# Patient Record
Sex: Female | Born: 1950 | Race: White | Hispanic: No | Marital: Married | Smoking: Never smoker
Health system: Southern US, Community
[De-identification: ages and names within clinical notes are randomized; demographics above are authoritative.]

## PROBLEM LIST (undated history)

## (undated) DIAGNOSIS — R519 Headache, unspecified: Secondary | ICD-10-CM

## (undated) DIAGNOSIS — R51 Headache: Secondary | ICD-10-CM

## (undated) DIAGNOSIS — N3010 Interstitial cystitis (chronic) without hematuria: Secondary | ICD-10-CM

## (undated) DIAGNOSIS — M549 Dorsalgia, unspecified: Secondary | ICD-10-CM

## (undated) DIAGNOSIS — M1990 Unspecified osteoarthritis, unspecified site: Secondary | ICD-10-CM

## (undated) DIAGNOSIS — K219 Gastro-esophageal reflux disease without esophagitis: Secondary | ICD-10-CM

## (undated) DIAGNOSIS — L817 Pigmented purpuric dermatosis: Secondary | ICD-10-CM

## (undated) DIAGNOSIS — K5792 Diverticulitis of intestine, part unspecified, without perforation or abscess without bleeding: Secondary | ICD-10-CM

## (undated) DIAGNOSIS — I10 Essential (primary) hypertension: Secondary | ICD-10-CM

## (undated) DIAGNOSIS — K589 Irritable bowel syndrome without diarrhea: Secondary | ICD-10-CM

## (undated) DIAGNOSIS — F419 Anxiety disorder, unspecified: Secondary | ICD-10-CM

## (undated) HISTORY — PX: CYSTOSTOMY W/ BLADDER DILATION: SHX1432

## (undated) HISTORY — PX: BACK SURGERY: SHX140

## (undated) HISTORY — PX: OTHER SURGICAL HISTORY: SHX169

## (undated) HISTORY — PX: BREAST SURGERY: SHX581

## (undated) HISTORY — PX: BREAST EXCISIONAL BIOPSY: SUR124

## (undated) HISTORY — PX: CHOLECYSTECTOMY: SHX55

## (undated) HISTORY — PX: ABDOMINAL HYSTERECTOMY: SHX81

## (undated) HISTORY — PX: MENISCUS REPAIR: SHX5179

## (undated) HISTORY — PX: DENTAL SURGERY: SHX609

---

## 1999-03-28 ENCOUNTER — Other Ambulatory Visit: Admission: RE | Admit: 1999-03-28 | Discharge: 1999-03-28 | Payer: Self-pay | Admitting: Obstetrics and Gynecology

## 2000-03-27 ENCOUNTER — Encounter: Admission: RE | Admit: 2000-03-27 | Discharge: 2000-03-27 | Payer: Self-pay

## 2001-04-12 ENCOUNTER — Encounter: Admission: RE | Admit: 2001-04-12 | Discharge: 2001-04-12 | Payer: Self-pay | Admitting: Obstetrics and Gynecology

## 2001-04-12 ENCOUNTER — Encounter: Payer: Self-pay | Admitting: Obstetrics and Gynecology

## 2001-05-11 ENCOUNTER — Other Ambulatory Visit: Admission: RE | Admit: 2001-05-11 | Discharge: 2001-05-11 | Payer: Self-pay | Admitting: Obstetrics and Gynecology

## 2001-11-02 ENCOUNTER — Encounter: Admission: RE | Admit: 2001-11-02 | Discharge: 2001-11-02 | Payer: Self-pay | Admitting: Internal Medicine

## 2001-11-02 ENCOUNTER — Encounter: Payer: Self-pay | Admitting: Internal Medicine

## 2002-03-07 ENCOUNTER — Encounter: Admission: RE | Admit: 2002-03-07 | Discharge: 2002-03-07 | Payer: Self-pay | Admitting: Oral Surgery

## 2002-03-07 ENCOUNTER — Encounter: Payer: Self-pay | Admitting: Oral Surgery

## 2002-03-09 ENCOUNTER — Ambulatory Visit (HOSPITAL_BASED_OUTPATIENT_CLINIC_OR_DEPARTMENT_OTHER): Admission: RE | Admit: 2002-03-09 | Discharge: 2002-03-09 | Payer: Self-pay | Admitting: *Deleted

## 2002-05-12 ENCOUNTER — Encounter: Admission: RE | Admit: 2002-05-12 | Discharge: 2002-05-12 | Payer: Self-pay | Admitting: Obstetrics and Gynecology

## 2002-05-12 ENCOUNTER — Encounter: Payer: Self-pay | Admitting: Obstetrics and Gynecology

## 2002-05-12 ENCOUNTER — Other Ambulatory Visit: Admission: RE | Admit: 2002-05-12 | Discharge: 2002-05-12 | Payer: Self-pay | Admitting: Obstetrics and Gynecology

## 2003-06-20 ENCOUNTER — Encounter: Admission: RE | Admit: 2003-06-20 | Discharge: 2003-06-20 | Payer: Self-pay | Admitting: Obstetrics and Gynecology

## 2004-07-31 ENCOUNTER — Encounter: Admission: RE | Admit: 2004-07-31 | Discharge: 2004-07-31 | Payer: Self-pay | Admitting: Obstetrics and Gynecology

## 2004-10-11 ENCOUNTER — Other Ambulatory Visit: Admission: RE | Admit: 2004-10-11 | Discharge: 2004-10-11 | Payer: Self-pay | Admitting: Obstetrics and Gynecology

## 2005-03-17 ENCOUNTER — Inpatient Hospital Stay (HOSPITAL_COMMUNITY): Admission: AD | Admit: 2005-03-17 | Discharge: 2005-03-18 | Payer: Self-pay | Admitting: Internal Medicine

## 2005-09-09 ENCOUNTER — Encounter: Admission: RE | Admit: 2005-09-09 | Discharge: 2005-09-09 | Payer: Self-pay | Admitting: Obstetrics and Gynecology

## 2005-09-25 ENCOUNTER — Encounter: Admission: RE | Admit: 2005-09-25 | Discharge: 2005-09-25 | Payer: Self-pay | Admitting: Obstetrics and Gynecology

## 2006-09-22 ENCOUNTER — Encounter: Admission: RE | Admit: 2006-09-22 | Discharge: 2006-09-22 | Payer: Self-pay | Admitting: Obstetrics and Gynecology

## 2007-03-01 ENCOUNTER — Ambulatory Visit (HOSPITAL_COMMUNITY): Admission: RE | Admit: 2007-03-01 | Discharge: 2007-03-01 | Payer: Self-pay | Admitting: Urology

## 2007-04-05 ENCOUNTER — Encounter: Admission: RE | Admit: 2007-04-05 | Discharge: 2007-04-05 | Payer: Self-pay | Admitting: Internal Medicine

## 2007-05-03 ENCOUNTER — Encounter (INDEPENDENT_AMBULATORY_CARE_PROVIDER_SITE_OTHER): Payer: Self-pay | Admitting: General Surgery

## 2007-05-03 ENCOUNTER — Ambulatory Visit (HOSPITAL_COMMUNITY): Admission: RE | Admit: 2007-05-03 | Discharge: 2007-05-03 | Payer: Self-pay | Admitting: General Surgery

## 2007-08-08 ENCOUNTER — Emergency Department (HOSPITAL_COMMUNITY): Admission: EM | Admit: 2007-08-08 | Discharge: 2007-08-08 | Payer: Self-pay | Admitting: Emergency Medicine

## 2007-11-24 ENCOUNTER — Encounter: Admission: RE | Admit: 2007-11-24 | Discharge: 2007-11-24 | Payer: Self-pay | Admitting: Obstetrics and Gynecology

## 2007-12-06 ENCOUNTER — Encounter: Admission: RE | Admit: 2007-12-06 | Discharge: 2007-12-06 | Payer: Self-pay | Admitting: Obstetrics and Gynecology

## 2008-09-04 DIAGNOSIS — C4491 Basal cell carcinoma of skin, unspecified: Secondary | ICD-10-CM

## 2008-09-04 HISTORY — DX: Basal cell carcinoma of skin, unspecified: C44.91

## 2009-03-29 ENCOUNTER — Encounter: Admission: RE | Admit: 2009-03-29 | Discharge: 2009-03-29 | Payer: Self-pay | Admitting: Obstetrics and Gynecology

## 2009-08-08 ENCOUNTER — Encounter: Admission: RE | Admit: 2009-08-08 | Discharge: 2009-08-08 | Payer: Self-pay | Admitting: Internal Medicine

## 2009-09-13 DIAGNOSIS — D239 Other benign neoplasm of skin, unspecified: Secondary | ICD-10-CM

## 2009-09-13 HISTORY — DX: Other benign neoplasm of skin, unspecified: D23.9

## 2010-02-14 ENCOUNTER — Encounter: Admission: RE | Admit: 2010-02-14 | Discharge: 2010-02-14 | Payer: Self-pay | Admitting: Sports Medicine

## 2010-09-15 ENCOUNTER — Encounter: Payer: Self-pay | Admitting: Obstetrics and Gynecology

## 2011-01-07 NOTE — Op Note (Signed)
Rebecca Charles, Rebecca Charles                 ACCOUNT NO.:  [id number]   MEDICAL RECORD NO.:  [id number]          PATIENT TYPE:  AMB   LOCATION:  DAY                          FACILITY:  [facility]   PHYSICIAN:  Timothy E. Earlene Plater, M.D. DATE OF BIRTH:  [birth year]   DATE OF PROCEDURE:  DATE OF DISCHARGE:                               OPERATIVE REPORT   PREOPERATIVE DIAGNOSIS:  Chronic cholecystolithiasis.   POSTOPERATIVE DIAGNOSIS:  Chronic cholecystolithiasis.   PROCEDURE:  Laparoscopic cholecystectomy and operative cholangiogram.   SURGEON:  Kendrick Ranch, MD   ASSISTANT:  Marca Ancona, MD   ANESTHESIA:  General.   INDICATION:  [name] is otherwise healthy, 74, with longstanding  chronic cholecystolithiasis symptoms and documented gallstones.  After  careful discussion, she is ready and prepared to have this surgery done  today.   She was seen, identified and the permit signed.   DESCRIPTION OF PROCEDURE:  She is taken to the operating room, placed  supine and general endotracheal anesthesia administered.  The abdomen  was prepped and draped in the usual fashion.  Marcaine 0.25% with  epinephrine was used prior to each incision.  A vertical infraumbilical  incision was made, the fascia identified and opened vertically.  As  well, a tiny lobule of preperitoneal fat protruded through a defect in  the fascia; this fat was cauterized and removed and the incision  extended into the miniscule defect in the fascia.  Hasson cannula was  placed and tied in place with a #1 Vicryl, the abdomen insufflated.  General peritoneoscopy was unremarkable.  The gallbladder was thickened,  but with minimal adhesions.  Stones were present.  A second 10-mm trocar  was placed in the midepigastrium and two 5-mm trocars in the right upper  quadrant.  The gallbladder was grasped and placed on tension.  Using  careful dissection and cautery, the cystic duct was isolated in its  entirety.  A clip was placed on the  gallbladder side and the duct was  opened.  A percutaneously-passed catheter was placed in the remnant of  the cystic duct and a real-time cholangiogram made, showing a small  system, but free flow of dye into the duodenum and a complete anatomic  system as well as the pancreatic duct.  The clip and catheter were  removed, the stump of the cystic duct doubly clipped and divided.  Two  branches of the cystic artery were identified; each was individually  clipped and divided.  The gallbladder was removed from the gallbladder  bed without complication or incident.  The gallbladder was removed  through the infraumbilical incision, which was then tied under direct  vision.  Irrigation was carried out, revealing no blood or bile.  All  irrigant, CO2, instruments and trocars were removed under direct vision.  All counts were correct.  Each incision was checked and closed with  Monocryl and Steri-Strips.  Final counts were correct.  She tolerated it  well and was removed to the recovery room in good condition.   She will be watched the afternoon and expect to be sent home today.  She  will be followed in the office.      Timothy E. Earlene Plater, M.D.  Electronically Signed     TED/MEDQ  D:  05/03/2007  T:  05/04/2007  Job:  [id number]   cc:   Georgann Housekeeper, MD  Fax: [phone]

## 2011-01-07 NOTE — Op Note (Signed)
NAMEMEL, TADROS                 ACCOUNT NO.:  [id number]   MEDICAL RECORD NO.:  [id number]          PATIENT TYPE:  AMB   LOCATION:  DAY                          FACILITY:  [facility]   PHYSICIAN:  Sigmund I. Patsi Sears, M.D.DATE OF BIRTH:  02-May-1951   DATE OF PROCEDURE:  03/01/2007  DATE OF DISCHARGE:                               OPERATIVE REPORT   PREOPERATIVE DIAGNOSIS:  Interstitial cystitis.   POSTOPERATIVE DIAGNOSIS:  Interstitial cystitis.   OPERATION:  Cystourethroscopy, hydrodistention of the bladder (700-750  mL), photo documentation of interstitial cystitis, instillation of  Pyridium and Marcaine, injection of Marcaine and Pyridium in the  subtrigonal space, B&O suppository, an IV Toradol placement.   SURGEON:  Sigmund I. Patsi Sears, M.D.   ANESTHESIA:  General LMA.   PREPARATION:  After appropriate preanesthesia, the patient was brought  to the operating room and placed on the operating room table in the  dorsal supine position where general LMA anesthesia was introduced.  She  was then replaced in the dorsal lithotomy position where the pubis was  prepped with Betadine solution and draped in usual fashion.   REVIEW OF HISTORY:  This 61 year old female has history of interstitial  cystitis, status post cysto HOD with many years of normal voiding.  However, over the last several months, the patient has developed  dyspareunia, suprapubic pain, vaginal discomfort, and spasm.  She is now  for cysto, HOD, and bladder instillation.   PROCEDURE:  Cystourethroscopy was accomplished and shows a normal-  appearing bladder.  The bladder holds 700 mL and is hydrodistended to  750 mL.  Second look cysto shows that the patient has multiple areas of  hemorrhage, and these are photo documented.   Marcaine and Pyridium is inserted in the bladder, Marcaine and Kenalog  is injected the subtrigonal space.  The patient was awakened and taken  to the recovery room after being given  Toradol.  The the patient was  given a B&O suppository at anesthetic induction.      Sigmund I. Patsi Sears, M.D.  Electronically Signed     SIT/MEDQ  D:  03/01/2007  T:  03/01/2007  Job:  [id number]

## 2011-01-10 NOTE — Cardiovascular Report (Signed)
Rebecca Charles, Rebecca Charles                 ACCOUNT NO.:  [id number]   MEDICAL RECORD NO.:  [id number]          PATIENT TYPE:  INP   LOCATION:  4707                         FACILITY:  [facility]   PHYSICIAN:  Lyn Records, M.D.   DATE OF BIRTH:  03-29-1951   DATE OF PROCEDURE:  03/18/2005  DATE OF DISCHARGE:                              CARDIAC CATHETERIZATION   INDICATIONS FOR PROCEDURE:  Prolonged chest discomfort consistent with  ischemic pain and new left bundle branch block.   PROCEDURES PERFORMED:  1.  Left heart catheterization.  2.  Coronary angiography.  3.  Left ventriculography.  4.  AngioSeal arteriotomy closure.   DESCRIPTION:  After informed consent, a 6-French sheath was placed in the  right femoral artery using the modified Seldinger technique, following 1%  Xylocaine local infiltration. IV Versed 1 mg had been administered for  sedation.   Left heart cath and selective angiography of the right coronary was  performed with an A2 multipurpose 6-French catheter. Left coronary  angiography was performed with #4 6-French left Judkins catheter. AngioSeal  arteriotomy closure was performed after angiographic demonstration of  appropriate anatomy. Good hemostasis was achieved.   RESULTS:  1.  Hemodynamic data:      1.  Aortic pressure 154/71.      2.  Left ventricular pressure 156/60 mmHg.   1.  Left ventriculography:  Left ventricular cavity size and systolic      function are normal. EF is 65-75%. No MR.   1.  Coronary angiography:      1.  Left main coronary:  Normal.      2.  Left anterior descending coronary:  This is a large vessel, multiple          moderate size diagonals arise from it. No obstruction is noted in          the LAD and in fact, it was felt to be normal.      3.  Circumflex artery:  Circumflex gives origin to one obtuse marginal          and is normal.      4.  Right coronary:  The right coronary is dominant, gives several large          left  ventricular branches and a large PDA. The right coronary is          normal.   1.  Successful Angio-Seal arteriotomy closure with good hemostasis.   CONCLUSION:  1.  Essentially normal coronary arteries.  2.  Normal left ventricular function size and function. No evidence of      cardiomyopathy.   PLAN:  Blood pressure control.  Discharge later today. Further management  per Dr. Donette Larry.  Consider GI workup.      Lyn Records, M.D.  Electronically Signed     HWS/MEDQ  D:  03/18/2005  T:  03/18/2005  Job:  [id number]

## 2011-01-10 NOTE — Op Note (Signed)
[place]  Patient:    Rebecca Charles, Rebecca Charles Visit Number: [id number] MRN: [id number]          Service Type: DSU Location: [facility] Attending Physician:  Retia Passe Dictated by:   Vania Rea. Warren Danes, D.D.S. Proc. Date: 03/09/02 Admit Date:  03/09/2002 Discharge Date: 03/09/2002                             Operative Report  PREOPERATIVE DIAGNOSES:  Bilateral mandibulolingual tori; left mandibular posterior alveolar insufficiency.  POSTOPERATIVE DIAGNOSES:  Bilateral manidbulolingual tori; left mandibular posterior alveolar insufficiency.  SURGERY PERFORMED:  Removal of bilateral mandibular tori; placement of BIO-OSS bone grafting material - left mandible.  SURGEON:  Vania Rea. Warren Danes, D.D.S.  ASSISTANTS:  [employer].  ANESTHESIA:  General via nasoendotracheal intubation.  ESTIMATED BLOOD LOSS:  Less than 50 cc.  FLUID REPLACEMENT:  Approximately 1000 cc of Crystalloid solution.  COMPLICATIONS:  None apparent.  INDICATIONS FOR PROCEDURE:  The patient is a 60 year old female who was referred to my office for reduction of the mandibular tori and placement of bone graft in the left mandible.  The patients large tori were preventing dental radiographs and causing chronic pain with feeding and chewing.  The patient was in need of dental implants in the left mandible to aid in chewing, but due to severe mandibular atrophy, implants could not be placed unless bone grafting material was used to reconstruct the mandible to accommodate the implants.  DETAILS OF PROCEDURE:  On March 09, 2002 the patient was taken to [facility], where she was placed on the operating table in a supine position.  Upon successful nasoendotracheal intubation and general anesthesia, the patients face, and oral cavity were prepped and draped in the usual sterile operating room fashion.  Attention was then directed  intraorally, where approximately 10 cc of 0.5% Xylocaine containing 1:200,000 epinephrine were infiltrated into the right retroinferior alveolar neurovascular regions.  A #15 Bard-Parker blade was then used to create a full-thickness mucoperiosteal incision around the lingual aspect of the mandibular dentition, from the first molar to the canine region bilaterally. A #9 Molt periosteal elevator was then used to reflect a full-thickness periosteal fat, exposing the right and left mandibulolingual tori.  The tori were identified and then reduced, using a Stryker rotary osteotome; then a #8 round bur and a flame-shaped alveolar bur.  The area was then thoroughly irrigated with sterile saline irrigating solutions, and the mucoperiosteal margins approximated and sutured in an interrupted fashion using 4-0 chromic suture material.  Attention was then directed towards the left posterolateral mandible, where a 1.0 curvilinear incision was made; the depth of the vestibule beside tooth #20.  A #9 Molt periosteal elevator was then used to reflect a full-thickness mucoperiosteal flap along the alveolar crest, and laterally for a distance of approximately 0.5 cm.  Approximately 1 g of BIO-OSS bone grafting material was then placed between the periosteum and the alveolus, reconstructing the posterior left mandible to its normal anatomic shape.  The area was then sutured in a watertight fashion, using 4-0 chromic suture material on a continuous interlocking fashion on a FS2 needle.  The oral cavity was then thoroughly irrigated and suctioned.  The throat pack was removed and the hypopharynx suctioned free of fluids and secretions.  The patient was allowed to awaken from the anesthesia.  She was taken to the recovery room, where she  tolerated the procedure well without apparent complication. Dictated by:   Vania Rea. Warren Danes, D.D.S. Attending Physician:  Retia Passe DD:  03/09/02 TD:   03/12/02 Job: [id number]

## 2011-01-10 NOTE — Consult Note (Signed)
Rebecca Charles, Rebecca Charles                 ACCOUNT NO.:  [id number]   MEDICAL RECORD NO.:  [id number]          PATIENT TYPE:  INP   LOCATION:  4707                         FACILITY:  [facility]   PHYSICIAN:  Lyn Records, M.D.   DATE OF BIRTH:  [birth year]   DATE OF CONSULTATION:  03/17/2005  DATE OF DISCHARGE:                                   CONSULTATION   REASON FOR CONSULTATION:  Chest discomfort.   CONCLUSION:  1.  Prolonged chest discomfort of uncertain cause.  Rule out unstable      angina.  2.  New left bundle branch block on EKG.  3.  Hypertension.  4.  Family history of coronary artery disease.   RECOMMENDATIONS:  1.  Serial cardiac enzymes to rule out MI.  2.  EKG in a.m.  3.  Check chest x-ray and BNP.  4.  Aspirin.  5.  Lovenox and heparin.  6.  IV nitroglycerin.  7.  Coronary angiography March 18, 2005, to establish coronary anatomy, LV      function and to help guide therapy.   COMMENTS:  The patient is 60 years of age and has a significant family  history of coronary atherosclerotic heart disease.  She developed complaints  of chest tightness on the morning of admission that had some radiation into  the neck.  There was some shortness of breath noted over the past several  weeks, felt to be possibly related to reflux.  IV nitroglycerin, once  started here in the hospital, has relieved the chest discomfort but caused a  headache.   Her usual medical therapy includes estradiol and Fiorinal.   SIGNIFICANT MEDICAL PROBLEMS:  1.  History of migraines.  2.  Irritable colon.  3.  Elevated blood pressure.   FAMILY HISTORY:  Her father and brother both had or have coronary disease  prior to age 3.  Her lipid status is unknown.   HABITS:  She does not smoke or drink.   PHYSICAL EXAMINATION:  GENERAL APPEARANCE:  Patient is in no acute distress.  VITAL SIGNS:  Blood pressure on admission was 160/90, by the time I am  seeing her it is 140/80 but she is on IV nitroglycerin.   Heart rate is 60.  HEENT:  Unremarkable.  No xanthelasma.  NECK:  No carotid bruits are heard.  Carotid upstroke is 2+.  LUNGS:  Clear.  CARDIOVASCULAR:  Unremarkable.  No murmurs, rubs, gallops, or clicks heard.  ABDOMEN:  Soft.  EXTREMITIES:  No edema.  NEUROLOGIC:  Unremarkable.   EKG reveals left bundle branch block without acute abnormality being noted.  This is in comparison with an EKG performed in February of 2002, that was  entirely normal.  Cardiac markers are normal.   Chest x-ray is unremarkable.      Lyn Records, M.D.  Electronically Signed     HWS/MEDQ  D:  03/18/2005  T:  03/18/2005  Job:  [id number]

## 2011-01-10 NOTE — Discharge Summary (Signed)
Rebecca Charles, Rebecca Charles                 ACCOUNT NO.:  [id number]   MEDICAL RECORD NO.:  [id number]          PATIENT TYPE:  INP   LOCATION:  4707                         FACILITY:  [facility]   PHYSICIAN:  Georgann Housekeeper, MD      DATE OF BIRTH:  October 02, 1950   DATE OF ADMISSION:  03/17/2005  DATE OF DISCHARGE:  03/18/2005                                 DISCHARGE SUMMARY   DISCHARGE DIAGNOSES:  1.  Chest pain, rule out myocardial infarction.  2.  Hypertension, and as far as the acid reflux disease and anxiety      disorder, __________   MEDICATION ON DISCHARGE:  1.  Fiorinal 1-2 q.6 p.r.n.  2.  Calcium.  3.  Toprol 50 mg daily.   CONSULTATION:  Cardiology.   PROCEDURE:  Cardiac catheterization showed a normal cath, normal LV  function, and as far as the chest x-ray negative.  EKG:  Left bundle branch  block.   HOSPITAL COURSE:  The patient is 60 years old with history of hypertension,  admitted from the office with chest discomfort along with history of left  bundle branch block, questionable new.  Admitted to telemetry, started with  beta-blocker, IV nitroglycerin and IV heparin and aspirin.  Cardiology  consulted because of family history and her hypertension and underwent  cardiac catheterization which was completely negative cath.  Normal LV  function.  Her blood chemistries and cardiac markers were negative.  She had  started on Toprol, her cardiac chest pain was felt to be noncardiac or  possibly related to hypertension.  Her blood pressure initially on admission  was 170/90, at the time of discharge was 154/70.  For acid reflux disease,  patient had been on Aciphex at home, was to continue that.  Patient was  discharged home stable.  Followup with me at the office for blood pressure  monitoring.      Georgann Housekeeper, MD  Electronically Signed     KH/MEDQ  D:  05/13/2005  T:  05/13/2005  Job:  [id number]

## 2011-01-10 NOTE — H&P (Signed)
NAMENAVNEET, SCHMUCK                 ACCOUNT NO.:  [id number]   MEDICAL RECORD NO.:  [id number]          PATIENT TYPE:  INP   LOCATION:  4707                         FACILITY:  [facility]   PHYSICIAN:  Georgann Housekeeper, MD      DATE OF BIRTH:  Sep 09, 1950   DATE OF ADMISSION:  03/17/2005  DATE OF DISCHARGE:                                HISTORY & PHYSICAL   CHIEF COMPLAINT:  Chest pain, shortness of breath.   HISTORY OF PRESENT ILLNESS:  A 60 year old female with history of mild  migraine headaches, postmenopausal, elevated blood pressure in the past, who  comes in today to the office complaining of this chest discomfort and  tightness that started today.  Some shortness of breath, no nausea and  vomiting.  No palpitations.  She says the pain sometimes goes up the neck.  She had in the past some chest discomfort and she attributed that to acid  reflux disease.  Today in the office her blood pressure initially was 180/90  and then dropped down to 160/90.  As far as she was not diaphoretic.  EKG  obtained in the office showed a left bundle branch block with a rate of 70.  Prior EKG in 2002 showed normal sinus rhythm.  As far as she did receive a  nitroglycerin in the office.  Her chest pain and shortness of breath much  improved.  Initially she had a tightness of 4 to 6 range and came down to 2  and hemodynamically stable.  She was admitted for further evaluation.   PAST MEDICAL HISTORY:  1.  History of migraine headaches.  2.  History of interstitial cystitis followed by Lynelle Smoke I. Patsi Sears, M.D.      for bladder dilation in 1992.  3.  Irritable bowel syndrome.  4.  Elevated blood pressure.  5.  Squamous cell skin cancer of the nose.  6.  Mild acid reflux disease.   ALLERGIES:  No known drug allergies.   MEDICATIONS:  1.  Fiorinal one to two q.4-6h. p.r.n.  2.  Estradiol 2 mg daily.  3.  Calcium, vitamin D  4.  Perdiem Plus one tablet for interstitial cystitis as needed.   PAST  SURGICAL HISTORY:  TAH and cyst removed.  Bilateral dilation for  interstitial cystitis.  Right lumpectomy. Cyst drained in March of 1999,  benign.  Arthroscopic left knee surgery in 2001.   FAMILY HISTORY:  Father died at 53 with Wegner's disease and CHF.  Mother is  86 with hypertension.  One brother has coronary artery disease.   SOCIAL HISTORY:  No tobacco.  Alcohol occasional.  Exercise some.   PHYSICAL EXAMINATION:  VITAL SIGNS:  Blood pressure initially 170/90,  subsequently was 180/90, and then 160/90, pulse 70, afebrile, respirations  14.  GENERAL:  Awake and alert, no acute distress with some discomfort.  No  diaphoresis and no nausea.  LUNGS:  Clear.  HEART:  Regular S1 and S2 without murmurs.  No carotid bruits.  ABDOMEN:  Soft.  EXTREMITIES:  No edema.   EKG obtained in the office showed  left bundle branch block with a rate of 70  without any ST T wave changes.  Prior EKG in 2002 showed normal sinus  rhythm.   IMPRESSION:  A 60 year old female with chest discomfort, elevated blood  pressure, new left bundle branch block on EKG.   1.  Chest pain, rule out ischemic etiology.  2.  Unstable angina.  3.  Elevated blood pressure.   PLAN:  Admit to telemetry at [facility]  Will get some blood work,  cardiac markers, and other blood work.  Chest x-ray.  Start on nitroglycerin  drip, heparin drip, and aspirin.  Lopressor 25 b.i.d.  Cardiology consult.  She probably needs a cardiac catheterization even she ruled out because of  her family history and left bundle branch block.  I discussed with her and  her husband and the patient was sent to [facility] and cardiology  consulted.       KH/MEDQ  D:  03/17/2005  T:  03/17/2005  Job:  [id number]

## 2011-02-17 ENCOUNTER — Other Ambulatory Visit: Payer: Self-pay | Admitting: Obstetrics and Gynecology

## 2011-02-17 DIAGNOSIS — Z1231 Encounter for screening mammogram for malignant neoplasm of breast: Secondary | ICD-10-CM

## 2011-03-05 ENCOUNTER — Ambulatory Visit
Admission: RE | Admit: 2011-03-05 | Discharge: 2011-03-05 | Disposition: A | Discharge status: Home / Self Care | Payer: BC Managed Care – PPO | Source: Ambulatory Visit | Attending: Obstetrics and Gynecology | Admitting: Obstetrics and Gynecology

## 2011-03-05 DIAGNOSIS — Z1231 Encounter for screening mammogram for malignant neoplasm of breast: Secondary | ICD-10-CM

## 2011-03-10 ENCOUNTER — Other Ambulatory Visit: Payer: Self-pay | Admitting: Obstetrics and Gynecology

## 2011-03-10 DIAGNOSIS — R928 Other abnormal and inconclusive findings on diagnostic imaging of breast: Secondary | ICD-10-CM

## 2011-03-11 ENCOUNTER — Ambulatory Visit
Admission: RE | Admit: 2011-03-11 | Discharge: 2011-03-11 | Disposition: A | Discharge status: Home / Self Care | Payer: BC Managed Care – PPO | Source: Ambulatory Visit | Attending: Obstetrics and Gynecology | Admitting: Obstetrics and Gynecology

## 2011-03-11 DIAGNOSIS — R928 Other abnormal and inconclusive findings on diagnostic imaging of breast: Secondary | ICD-10-CM

## 2011-03-21 ENCOUNTER — Other Ambulatory Visit: Payer: Self-pay | Admitting: Neurosurgery

## 2011-03-21 DIAGNOSIS — M549 Dorsalgia, unspecified: Secondary | ICD-10-CM

## 2011-03-23 ENCOUNTER — Ambulatory Visit
Admission: RE | Admit: 2011-03-23 | Discharge: 2011-03-23 | Disposition: A | Discharge status: Home / Self Care | Payer: BC Managed Care – PPO | Source: Ambulatory Visit | Attending: Neurosurgery | Admitting: Neurosurgery

## 2011-03-23 DIAGNOSIS — M549 Dorsalgia, unspecified: Secondary | ICD-10-CM

## 2011-03-23 MED ORDER — GADOBENATE DIMEGLUMINE 529 MG/ML IV SOLN
12.0000 mL | Freq: Once | INTRAVENOUS | Status: AC | PRN
  Administered 2011-03-23: 12 mL via INTRAVENOUS

## 2011-06-06 LAB — DIFFERENTIAL
Basophils Absolute: 0
Basophils Relative: 1
Eosinophils Absolute: 0
Eosinophils Relative: 0
Lymphocytes Relative: 31
Lymphs Abs: 1.1
Monocytes Absolute: 0.3
Monocytes Relative: 8
Neutro Abs: 2.2
Neutrophils Relative %: 60

## 2011-06-06 LAB — CBC
HCT: 38.6
Hemoglobin: 13.2
MCHC: 34.3
MCV: 90.5
Platelets: 174
RBC: 4.26
RDW: 13
WBC: 3.6 — ABNORMAL LOW

## 2011-06-06 LAB — COMPREHENSIVE METABOLIC PANEL
ALT: 16
AST: 20
Albumin: 4.1
Alkaline Phosphatase: 74
BUN: 6
CO2: 27
Calcium: 9.6
Chloride: 106
Creatinine, Ser: 1.02
GFR calc Af Amer: >60
GFR calc non Af Amer: 56 — ABNORMAL LOW
Glucose, Bld: 94
Potassium: 4.1
Sodium: 140
Total Bilirubin: 0.9
Total Protein: 7

## 2011-06-10 LAB — HEMOGLOBIN AND HEMATOCRIT, BLOOD
HCT: 38.4
Hemoglobin: 13.2

## 2011-06-10 LAB — BASIC METABOLIC PANEL
BUN: 7
CO2: 27
Calcium: 9.6
Chloride: 106
Creatinine, Ser: 0.85
GFR calc Af Amer: >60
GFR calc non Af Amer: >60
Glucose, Bld: 103 — ABNORMAL HIGH
Potassium: 3.5
Sodium: 141

## 2011-07-04 ENCOUNTER — Encounter: Payer: Self-pay | Admitting: Internal Medicine

## 2011-08-05 ENCOUNTER — Other Ambulatory Visit: Payer: BC Managed Care – PPO | Admitting: Internal Medicine

## 2012-08-20 ENCOUNTER — Other Ambulatory Visit: Payer: Self-pay | Admitting: Obstetrics and Gynecology

## 2012-08-20 DIAGNOSIS — Z1231 Encounter for screening mammogram for malignant neoplasm of breast: Secondary | ICD-10-CM

## 2012-09-02 ENCOUNTER — Ambulatory Visit
Admission: RE | Admit: 2012-09-02 | Discharge: 2012-09-02 | Disposition: A | Discharge status: Home / Self Care | Payer: BC Managed Care – PPO | Source: Ambulatory Visit | Attending: Obstetrics and Gynecology | Admitting: Obstetrics and Gynecology

## 2012-09-02 DIAGNOSIS — Z1231 Encounter for screening mammogram for malignant neoplasm of breast: Secondary | ICD-10-CM

## 2013-05-23 ENCOUNTER — Other Ambulatory Visit: Payer: Self-pay | Admitting: Neurosurgery

## 2013-05-23 DIAGNOSIS — M545 Low back pain, unspecified: Secondary | ICD-10-CM

## 2013-06-01 ENCOUNTER — Ambulatory Visit
Admission: RE | Admit: 2013-06-01 | Discharge: 2013-06-01 | Disposition: A | Discharge status: Home / Self Care | Payer: BC Managed Care – PPO | Source: Ambulatory Visit | Attending: Neurosurgery | Admitting: Neurosurgery

## 2013-06-01 VITALS — BP 110/54 | HR 73

## 2013-06-01 DIAGNOSIS — M545 Low back pain, unspecified: Secondary | ICD-10-CM

## 2013-06-01 MED ORDER — DIAZEPAM 5 MG PO TABS
10.0000 mg | ORAL_TABLET | Freq: Once | ORAL | Status: AC
  Administered 2013-06-01: 10 mg via ORAL

## 2013-06-01 MED ORDER — IOHEXOL 180 MG/ML  SOLN
15.0000 mL | Freq: Once | INTRAMUSCULAR | Status: AC | PRN
  Administered 2013-06-01: 15 mL via INTRAVENOUS

## 2013-06-01 NOTE — Progress Notes (Signed)
Patient states she has not taken Tramadol for at least the past two days.

## 2013-06-30 ENCOUNTER — Other Ambulatory Visit: Payer: Self-pay

## 2013-12-28 ENCOUNTER — Other Ambulatory Visit: Payer: Self-pay | Admitting: Internal Medicine

## 2013-12-28 ENCOUNTER — Ambulatory Visit
Admission: RE | Admit: 2013-12-28 | Discharge: 2013-12-28 | Disposition: A | Discharge status: Home / Self Care | Payer: BC Managed Care – PPO | Source: Ambulatory Visit | Attending: Internal Medicine | Admitting: Internal Medicine

## 2013-12-28 DIAGNOSIS — R05 Cough: Secondary | ICD-10-CM

## 2013-12-28 DIAGNOSIS — R059 Cough, unspecified: Secondary | ICD-10-CM

## 2014-03-03 ENCOUNTER — Other Ambulatory Visit: Payer: Self-pay

## 2014-03-03 DIAGNOSIS — Z1231 Encounter for screening mammogram for malignant neoplasm of breast: Secondary | ICD-10-CM

## 2014-03-17 ENCOUNTER — Ambulatory Visit
Admission: RE | Admit: 2014-03-17 | Discharge: 2014-03-17 | Disposition: A | Discharge status: Home / Self Care | Payer: BC Managed Care – PPO | Source: Ambulatory Visit

## 2014-03-17 DIAGNOSIS — Z1231 Encounter for screening mammogram for malignant neoplasm of breast: Secondary | ICD-10-CM

## 2014-05-04 ENCOUNTER — Other Ambulatory Visit: Payer: Self-pay | Admitting: Obstetrics and Gynecology

## 2014-05-05 LAB — CYTOLOGY - PAP

## 2014-08-07 ENCOUNTER — Ambulatory Visit
Admission: RE | Admit: 2014-08-07 | Discharge: 2014-08-07 | Disposition: A | Discharge status: Home / Self Care | Payer: BC Managed Care – PPO | Source: Ambulatory Visit | Attending: Internal Medicine | Admitting: Internal Medicine

## 2014-08-07 ENCOUNTER — Other Ambulatory Visit: Payer: Self-pay | Admitting: Internal Medicine

## 2014-08-07 DIAGNOSIS — R1031 Right lower quadrant pain: Secondary | ICD-10-CM

## 2014-08-07 MED ORDER — IOHEXOL 300 MG/ML  SOLN
100.0000 mL | Freq: Once | INTRAMUSCULAR | Status: AC | PRN
  Administered 2014-08-07: 100 mL via INTRAVENOUS

## 2014-11-21 DIAGNOSIS — S92515A Nondisplaced fracture of proximal phalanx of left lesser toe(s), initial encounter for closed fracture: Secondary | ICD-10-CM | POA: Diagnosis not present

## 2015-01-19 DIAGNOSIS — M25521 Pain in right elbow: Secondary | ICD-10-CM | POA: Diagnosis not present

## 2015-01-19 DIAGNOSIS — M79644 Pain in right finger(s): Secondary | ICD-10-CM | POA: Diagnosis not present

## 2015-01-19 DIAGNOSIS — S63630D Sprain of interphalangeal joint of right index finger, subsequent encounter: Secondary | ICD-10-CM | POA: Diagnosis not present

## 2015-01-31 DIAGNOSIS — Z23 Encounter for immunization: Secondary | ICD-10-CM | POA: Diagnosis not present

## 2015-01-31 DIAGNOSIS — K219 Gastro-esophageal reflux disease without esophagitis: Secondary | ICD-10-CM | POA: Diagnosis not present

## 2015-01-31 DIAGNOSIS — I10 Essential (primary) hypertension: Secondary | ICD-10-CM | POA: Diagnosis not present

## 2015-01-31 DIAGNOSIS — M810 Age-related osteoporosis without current pathological fracture: Secondary | ICD-10-CM | POA: Diagnosis not present

## 2015-01-31 DIAGNOSIS — N3010 Interstitial cystitis (chronic) without hematuria: Secondary | ICD-10-CM | POA: Diagnosis not present

## 2015-01-31 DIAGNOSIS — K589 Irritable bowel syndrome without diarrhea: Secondary | ICD-10-CM | POA: Diagnosis not present

## 2015-02-19 ENCOUNTER — Other Ambulatory Visit: Payer: Self-pay

## 2015-02-23 DIAGNOSIS — M7711 Lateral epicondylitis, right elbow: Secondary | ICD-10-CM | POA: Diagnosis not present

## 2015-03-06 DIAGNOSIS — M7711 Lateral epicondylitis, right elbow: Secondary | ICD-10-CM | POA: Diagnosis not present

## 2015-03-12 ENCOUNTER — Other Ambulatory Visit: Payer: Self-pay

## 2015-03-12 DIAGNOSIS — Z1231 Encounter for screening mammogram for malignant neoplasm of breast: Secondary | ICD-10-CM

## 2015-03-14 DIAGNOSIS — M7711 Lateral epicondylitis, right elbow: Secondary | ICD-10-CM | POA: Diagnosis not present

## 2015-03-23 ENCOUNTER — Ambulatory Visit
Admission: RE | Admit: 2015-03-23 | Discharge: 2015-03-23 | Disposition: A | Discharge status: Home / Self Care | Payer: BC Managed Care – PPO | Source: Ambulatory Visit

## 2015-03-23 DIAGNOSIS — Z1231 Encounter for screening mammogram for malignant neoplasm of breast: Secondary | ICD-10-CM

## 2015-03-28 DIAGNOSIS — M7711 Lateral epicondylitis, right elbow: Secondary | ICD-10-CM | POA: Diagnosis not present

## 2015-04-19 DIAGNOSIS — Z85828 Personal history of other malignant neoplasm of skin: Secondary | ICD-10-CM | POA: Diagnosis not present

## 2015-04-19 DIAGNOSIS — D1800 Hemangioma unspecified site: Secondary | ICD-10-CM | POA: Diagnosis not present

## 2015-04-19 DIAGNOSIS — L820 Inflamed seborrheic keratosis: Secondary | ICD-10-CM | POA: Diagnosis not present

## 2015-04-19 DIAGNOSIS — L219 Seborrheic dermatitis, unspecified: Secondary | ICD-10-CM | POA: Diagnosis not present

## 2015-04-19 DIAGNOSIS — L821 Other seborrheic keratosis: Secondary | ICD-10-CM | POA: Diagnosis not present

## 2015-04-19 DIAGNOSIS — L570 Actinic keratosis: Secondary | ICD-10-CM | POA: Diagnosis not present

## 2015-04-19 DIAGNOSIS — D485 Neoplasm of uncertain behavior of skin: Secondary | ICD-10-CM | POA: Diagnosis not present

## 2015-04-19 DIAGNOSIS — I781 Nevus, non-neoplastic: Secondary | ICD-10-CM | POA: Diagnosis not present

## 2015-04-19 DIAGNOSIS — Z1283 Encounter for screening for malignant neoplasm of skin: Secondary | ICD-10-CM | POA: Diagnosis not present

## 2015-04-19 DIAGNOSIS — L578 Other skin changes due to chronic exposure to nonionizing radiation: Secondary | ICD-10-CM | POA: Diagnosis not present

## 2015-04-19 DIAGNOSIS — I8393 Asymptomatic varicose veins of bilateral lower extremities: Secondary | ICD-10-CM | POA: Diagnosis not present

## 2015-05-11 ENCOUNTER — Other Ambulatory Visit: Payer: Self-pay | Admitting: Obstetrics and Gynecology

## 2015-05-11 DIAGNOSIS — Z01419 Encounter for gynecological examination (general) (routine) without abnormal findings: Secondary | ICD-10-CM | POA: Diagnosis not present

## 2015-05-11 DIAGNOSIS — Z6821 Body mass index (BMI) 21.0-21.9, adult: Secondary | ICD-10-CM | POA: Diagnosis not present

## 2015-05-14 LAB — CYTOLOGY - PAP

## 2015-06-21 DIAGNOSIS — L578 Other skin changes due to chronic exposure to nonionizing radiation: Secondary | ICD-10-CM | POA: Diagnosis not present

## 2015-06-21 DIAGNOSIS — L570 Actinic keratosis: Secondary | ICD-10-CM | POA: Diagnosis not present

## 2015-06-21 DIAGNOSIS — L821 Other seborrheic keratosis: Secondary | ICD-10-CM | POA: Diagnosis not present

## 2015-06-21 DIAGNOSIS — L820 Inflamed seborrheic keratosis: Secondary | ICD-10-CM | POA: Diagnosis not present

## 2015-10-05 DIAGNOSIS — N3010 Interstitial cystitis (chronic) without hematuria: Secondary | ICD-10-CM | POA: Diagnosis not present

## 2015-10-05 DIAGNOSIS — R3989 Other symptoms and signs involving the genitourinary system: Secondary | ICD-10-CM | POA: Diagnosis not present

## 2015-12-26 DIAGNOSIS — L820 Inflamed seborrheic keratosis: Secondary | ICD-10-CM | POA: Diagnosis not present

## 2015-12-26 DIAGNOSIS — L219 Seborrheic dermatitis, unspecified: Secondary | ICD-10-CM | POA: Diagnosis not present

## 2015-12-26 DIAGNOSIS — Z85828 Personal history of other malignant neoplasm of skin: Secondary | ICD-10-CM | POA: Diagnosis not present

## 2015-12-26 DIAGNOSIS — L570 Actinic keratosis: Secondary | ICD-10-CM | POA: Diagnosis not present

## 2015-12-26 DIAGNOSIS — L578 Other skin changes due to chronic exposure to nonionizing radiation: Secondary | ICD-10-CM | POA: Diagnosis not present

## 2015-12-26 DIAGNOSIS — D485 Neoplasm of uncertain behavior of skin: Secondary | ICD-10-CM | POA: Diagnosis not present

## 2016-01-11 ENCOUNTER — Other Ambulatory Visit: Payer: Self-pay | Admitting: Sports Medicine

## 2016-01-11 DIAGNOSIS — M5030 Other cervical disc degeneration, unspecified cervical region: Secondary | ICD-10-CM | POA: Diagnosis not present

## 2016-01-11 DIAGNOSIS — M5020 Other cervical disc displacement, unspecified cervical region: Secondary | ICD-10-CM

## 2016-01-11 DIAGNOSIS — M5412 Radiculopathy, cervical region: Secondary | ICD-10-CM

## 2016-01-19 ENCOUNTER — Ambulatory Visit
Admission: RE | Admit: 2016-01-19 | Discharge: 2016-01-19 | Disposition: A | Discharge status: Home / Self Care | Payer: Medicare Other | Source: Ambulatory Visit | Attending: Sports Medicine | Admitting: Sports Medicine

## 2016-01-19 DIAGNOSIS — M47812 Spondylosis without myelopathy or radiculopathy, cervical region: Secondary | ICD-10-CM | POA: Diagnosis not present

## 2016-01-19 DIAGNOSIS — M5412 Radiculopathy, cervical region: Secondary | ICD-10-CM

## 2016-01-19 DIAGNOSIS — M5030 Other cervical disc degeneration, unspecified cervical region: Secondary | ICD-10-CM

## 2016-01-19 DIAGNOSIS — M5020 Other cervical disc displacement, unspecified cervical region: Secondary | ICD-10-CM

## 2016-02-04 ENCOUNTER — Other Ambulatory Visit: Payer: Self-pay | Admitting: Sports Medicine

## 2016-02-04 DIAGNOSIS — M542 Cervicalgia: Secondary | ICD-10-CM

## 2016-02-14 ENCOUNTER — Ambulatory Visit
Admission: RE | Admit: 2016-02-14 | Discharge: 2016-02-14 | Disposition: A | Discharge status: Home / Self Care | Payer: BC Managed Care – PPO | Source: Ambulatory Visit | Attending: Sports Medicine | Admitting: Sports Medicine

## 2016-02-14 DIAGNOSIS — M542 Cervicalgia: Secondary | ICD-10-CM | POA: Diagnosis not present

## 2016-02-14 MED ORDER — IOPAMIDOL (ISOVUE-M 300) INJECTION 61%
1.0000 mL | Freq: Once | INTRAMUSCULAR | Status: AC | PRN
  Administered 2016-02-14: 1 mL via EPIDURAL

## 2016-02-14 MED ORDER — TRIAMCINOLONE ACETONIDE 40 MG/ML IJ SUSP (RADIOLOGY)
60.0000 mg | Freq: Once | INTRAMUSCULAR | Status: AC
  Administered 2016-02-14: 60 mg via EPIDURAL

## 2016-02-14 NOTE — Discharge Instructions (Signed)

## 2016-02-21 DIAGNOSIS — R51 Headache: Secondary | ICD-10-CM | POA: Diagnosis not present

## 2016-02-21 DIAGNOSIS — N3010 Interstitial cystitis (chronic) without hematuria: Secondary | ICD-10-CM | POA: Diagnosis not present

## 2016-02-21 DIAGNOSIS — K219 Gastro-esophageal reflux disease without esophagitis: Secondary | ICD-10-CM | POA: Diagnosis not present

## 2016-02-21 DIAGNOSIS — F419 Anxiety disorder, unspecified: Secondary | ICD-10-CM | POA: Diagnosis not present

## 2016-02-21 DIAGNOSIS — G4700 Insomnia, unspecified: Secondary | ICD-10-CM | POA: Diagnosis not present

## 2016-02-21 DIAGNOSIS — J309 Allergic rhinitis, unspecified: Secondary | ICD-10-CM | POA: Diagnosis not present

## 2016-02-21 DIAGNOSIS — I10 Essential (primary) hypertension: Secondary | ICD-10-CM | POA: Diagnosis not present

## 2016-02-25 DIAGNOSIS — M50023 Cervical disc disorder at C6-C7 level with myelopathy: Secondary | ICD-10-CM | POA: Diagnosis not present

## 2016-02-25 DIAGNOSIS — M50022 Cervical disc disorder at C5-C6 level with myelopathy: Secondary | ICD-10-CM | POA: Diagnosis not present

## 2016-02-25 DIAGNOSIS — M62830 Muscle spasm of back: Secondary | ICD-10-CM | POA: Diagnosis not present

## 2016-03-03 DIAGNOSIS — M50022 Cervical disc disorder at C5-C6 level with myelopathy: Secondary | ICD-10-CM | POA: Diagnosis not present

## 2016-03-03 DIAGNOSIS — M50023 Cervical disc disorder at C6-C7 level with myelopathy: Secondary | ICD-10-CM | POA: Diagnosis not present

## 2016-03-03 DIAGNOSIS — M62830 Muscle spasm of back: Secondary | ICD-10-CM | POA: Diagnosis not present

## 2016-03-06 DIAGNOSIS — M50022 Cervical disc disorder at C5-C6 level with myelopathy: Secondary | ICD-10-CM | POA: Diagnosis not present

## 2016-03-06 DIAGNOSIS — M62830 Muscle spasm of back: Secondary | ICD-10-CM | POA: Diagnosis not present

## 2016-03-06 DIAGNOSIS — M50023 Cervical disc disorder at C6-C7 level with myelopathy: Secondary | ICD-10-CM | POA: Diagnosis not present

## 2016-03-10 DIAGNOSIS — M62830 Muscle spasm of back: Secondary | ICD-10-CM | POA: Diagnosis not present

## 2016-03-10 DIAGNOSIS — M50023 Cervical disc disorder at C6-C7 level with myelopathy: Secondary | ICD-10-CM | POA: Diagnosis not present

## 2016-03-10 DIAGNOSIS — M50022 Cervical disc disorder at C5-C6 level with myelopathy: Secondary | ICD-10-CM | POA: Diagnosis not present

## 2016-03-13 DIAGNOSIS — M50023 Cervical disc disorder at C6-C7 level with myelopathy: Secondary | ICD-10-CM | POA: Diagnosis not present

## 2016-03-13 DIAGNOSIS — M62830 Muscle spasm of back: Secondary | ICD-10-CM | POA: Diagnosis not present

## 2016-03-13 DIAGNOSIS — M50022 Cervical disc disorder at C5-C6 level with myelopathy: Secondary | ICD-10-CM | POA: Diagnosis not present

## 2016-03-17 DIAGNOSIS — I8310 Varicose veins of unspecified lower extremity with inflammation: Secondary | ICD-10-CM | POA: Diagnosis not present

## 2016-03-17 DIAGNOSIS — D690 Allergic purpura: Secondary | ICD-10-CM | POA: Diagnosis not present

## 2016-03-20 DIAGNOSIS — M50022 Cervical disc disorder at C5-C6 level with myelopathy: Secondary | ICD-10-CM | POA: Diagnosis not present

## 2016-03-20 DIAGNOSIS — M5023 Other cervical disc displacement, cervicothoracic region: Secondary | ICD-10-CM | POA: Diagnosis not present

## 2016-03-20 DIAGNOSIS — M62830 Muscle spasm of back: Secondary | ICD-10-CM | POA: Diagnosis not present

## 2016-03-26 DIAGNOSIS — M50023 Cervical disc disorder at C6-C7 level with myelopathy: Secondary | ICD-10-CM | POA: Diagnosis not present

## 2016-03-26 DIAGNOSIS — M50022 Cervical disc disorder at C5-C6 level with myelopathy: Secondary | ICD-10-CM | POA: Diagnosis not present

## 2016-04-07 ENCOUNTER — Other Ambulatory Visit: Payer: Self-pay | Admitting: Obstetrics and Gynecology

## 2016-04-07 DIAGNOSIS — Z1231 Encounter for screening mammogram for malignant neoplasm of breast: Secondary | ICD-10-CM

## 2016-04-10 ENCOUNTER — Ambulatory Visit
Admission: RE | Admit: 2016-04-10 | Discharge: 2016-04-10 | Disposition: A | Discharge status: Home / Self Care | Payer: BC Managed Care – PPO | Source: Ambulatory Visit | Attending: Obstetrics and Gynecology | Admitting: Obstetrics and Gynecology

## 2016-04-10 DIAGNOSIS — Z1231 Encounter for screening mammogram for malignant neoplasm of breast: Secondary | ICD-10-CM

## 2016-05-07 DIAGNOSIS — M5090 Cervical disc disorder, unspecified, unspecified cervical region: Secondary | ICD-10-CM | POA: Diagnosis not present

## 2016-05-07 DIAGNOSIS — R51 Headache: Secondary | ICD-10-CM | POA: Diagnosis not present

## 2016-05-07 DIAGNOSIS — N3010 Interstitial cystitis (chronic) without hematuria: Secondary | ICD-10-CM | POA: Diagnosis not present

## 2016-05-07 DIAGNOSIS — R112 Nausea with vomiting, unspecified: Secondary | ICD-10-CM | POA: Diagnosis not present

## 2016-05-14 DIAGNOSIS — N390 Urinary tract infection, site not specified: Secondary | ICD-10-CM | POA: Diagnosis not present

## 2016-05-14 DIAGNOSIS — R5383 Other fatigue: Secondary | ICD-10-CM | POA: Diagnosis not present

## 2016-05-14 DIAGNOSIS — E876 Hypokalemia: Secondary | ICD-10-CM | POA: Diagnosis not present

## 2016-07-02 DIAGNOSIS — I781 Nevus, non-neoplastic: Secondary | ICD-10-CM | POA: Diagnosis not present

## 2016-07-02 DIAGNOSIS — Z1283 Encounter for screening for malignant neoplasm of skin: Secondary | ICD-10-CM | POA: Diagnosis not present

## 2016-07-02 DIAGNOSIS — D1800 Hemangioma unspecified site: Secondary | ICD-10-CM | POA: Diagnosis not present

## 2016-07-02 DIAGNOSIS — I8393 Asymptomatic varicose veins of bilateral lower extremities: Secondary | ICD-10-CM | POA: Diagnosis not present

## 2016-07-02 DIAGNOSIS — L720 Epidermal cyst: Secondary | ICD-10-CM | POA: Diagnosis not present

## 2016-07-02 DIAGNOSIS — Z85828 Personal history of other malignant neoplasm of skin: Secondary | ICD-10-CM | POA: Diagnosis not present

## 2016-07-02 DIAGNOSIS — C4492 Squamous cell carcinoma of skin, unspecified: Secondary | ICD-10-CM

## 2016-07-02 DIAGNOSIS — R21 Rash and other nonspecific skin eruption: Secondary | ICD-10-CM | POA: Diagnosis not present

## 2016-07-02 DIAGNOSIS — L821 Other seborrheic keratosis: Secondary | ICD-10-CM | POA: Diagnosis not present

## 2016-07-02 DIAGNOSIS — D485 Neoplasm of uncertain behavior of skin: Secondary | ICD-10-CM | POA: Diagnosis not present

## 2016-07-02 HISTORY — DX: Squamous cell carcinoma of skin, unspecified: C44.92

## 2016-07-29 DIAGNOSIS — L820 Inflamed seborrheic keratosis: Secondary | ICD-10-CM | POA: Diagnosis not present

## 2016-07-29 DIAGNOSIS — C44622 Squamous cell carcinoma of skin of right upper limb, including shoulder: Secondary | ICD-10-CM | POA: Diagnosis not present

## 2016-08-27 DIAGNOSIS — J069 Acute upper respiratory infection, unspecified: Secondary | ICD-10-CM | POA: Diagnosis not present

## 2016-09-17 DIAGNOSIS — H18413 Arcus senilis, bilateral: Secondary | ICD-10-CM | POA: Diagnosis not present

## 2016-09-22 DIAGNOSIS — I10 Essential (primary) hypertension: Secondary | ICD-10-CM | POA: Diagnosis not present

## 2016-09-22 DIAGNOSIS — R21 Rash and other nonspecific skin eruption: Secondary | ICD-10-CM | POA: Diagnosis not present

## 2016-10-15 ENCOUNTER — Other Ambulatory Visit: Payer: Self-pay | Admitting: Gastroenterology

## 2016-10-15 DIAGNOSIS — M810 Age-related osteoporosis without current pathological fracture: Secondary | ICD-10-CM | POA: Diagnosis not present

## 2016-11-24 ENCOUNTER — Encounter (HOSPITAL_COMMUNITY): Payer: Self-pay | Admitting: *Deleted

## 2016-11-25 ENCOUNTER — Encounter (HOSPITAL_COMMUNITY)
Admission: RE | Disposition: A | Discharge status: Home / Self Care | Payer: Self-pay | Source: Ambulatory Visit | Attending: Gastroenterology

## 2016-11-25 ENCOUNTER — Ambulatory Visit (HOSPITAL_COMMUNITY): Payer: BC Managed Care – PPO | Admitting: Certified Registered"

## 2016-11-25 ENCOUNTER — Encounter (HOSPITAL_COMMUNITY): Payer: Self-pay

## 2016-11-25 ENCOUNTER — Ambulatory Visit (HOSPITAL_COMMUNITY)
Admission: RE | Admit: 2016-11-25 | Discharge: 2016-11-25 | Disposition: A | Discharge status: Home / Self Care | Payer: BC Managed Care – PPO | Source: Ambulatory Visit | Attending: Gastroenterology | Admitting: Gastroenterology

## 2016-11-25 DIAGNOSIS — I10 Essential (primary) hypertension: Secondary | ICD-10-CM | POA: Diagnosis not present

## 2016-11-25 DIAGNOSIS — Z79899 Other long term (current) drug therapy: Secondary | ICD-10-CM | POA: Diagnosis not present

## 2016-11-25 DIAGNOSIS — K317 Polyp of stomach and duodenum: Secondary | ICD-10-CM | POA: Insufficient documentation

## 2016-11-25 DIAGNOSIS — F419 Anxiety disorder, unspecified: Secondary | ICD-10-CM | POA: Insufficient documentation

## 2016-11-25 DIAGNOSIS — Z1211 Encounter for screening for malignant neoplasm of colon: Secondary | ICD-10-CM | POA: Diagnosis not present

## 2016-11-25 DIAGNOSIS — R12 Heartburn: Secondary | ICD-10-CM | POA: Diagnosis not present

## 2016-11-25 DIAGNOSIS — M810 Age-related osteoporosis without current pathological fracture: Secondary | ICD-10-CM | POA: Diagnosis not present

## 2016-11-25 DIAGNOSIS — K219 Gastro-esophageal reflux disease without esophagitis: Secondary | ICD-10-CM | POA: Diagnosis not present

## 2016-11-25 DIAGNOSIS — Z9049 Acquired absence of other specified parts of digestive tract: Secondary | ICD-10-CM | POA: Insufficient documentation

## 2016-11-25 HISTORY — PX: COLONOSCOPY WITH PROPOFOL: SHX5780

## 2016-11-25 HISTORY — DX: Anxiety disorder, unspecified: F41.9

## 2016-11-25 HISTORY — DX: Interstitial cystitis (chronic) without hematuria: N30.10

## 2016-11-25 HISTORY — PX: ESOPHAGOGASTRODUODENOSCOPY (EGD) WITH PROPOFOL: SHX5813

## 2016-11-25 HISTORY — DX: Headache: R51

## 2016-11-25 HISTORY — DX: Irritable bowel syndrome without diarrhea: K58.9

## 2016-11-25 HISTORY — DX: Unspecified osteoarthritis, unspecified site: M19.90

## 2016-11-25 HISTORY — DX: Pigmented purpuric dermatosis: L81.7

## 2016-11-25 HISTORY — DX: Essential (primary) hypertension: I10

## 2016-11-25 HISTORY — DX: Headache, unspecified: R51.9

## 2016-11-25 HISTORY — DX: Gastro-esophageal reflux disease without esophagitis: K21.9

## 2016-11-25 HISTORY — DX: Diverticulitis of intestine, part unspecified, without perforation or abscess without bleeding: K57.92

## 2016-11-25 SURGERY — COLONOSCOPY WITH PROPOFOL
Anesthesia: Monitor Anesthesia Care

## 2016-11-25 MED ORDER — ONDANSETRON HCL 4 MG/2ML IJ SOLN
INTRAMUSCULAR | Status: DC | PRN
  Administered 2016-11-25: 4 mg via INTRAVENOUS

## 2016-11-25 MED ORDER — LACTATED RINGERS IV SOLN
INTRAVENOUS | Status: DC
  Administered 2016-11-25: 13:00:00 via INTRAVENOUS
  Administered 2016-11-25: 1000 mL via INTRAVENOUS

## 2016-11-25 MED ORDER — LIDOCAINE 2% (20 MG/ML) 5 ML SYRINGE
INTRAMUSCULAR | Status: DC | PRN
  Administered 2016-11-25: 40 mg via INTRAVENOUS

## 2016-11-25 MED ORDER — SODIUM CHLORIDE 0.9 % IV SOLN: INTRAVENOUS | Status: DC

## 2016-11-25 MED ORDER — PROPOFOL 10 MG/ML IV BOLUS
INTRAVENOUS | Status: AC
  Filled 2016-11-25: qty 40

## 2016-11-25 MED ORDER — PROPOFOL 10 MG/ML IV BOLUS
INTRAVENOUS | Status: DC | PRN
  Administered 2016-11-25 (×7): 40 mg via INTRAVENOUS

## 2016-11-25 SURGICAL SUPPLY — 22 items

## 2016-11-25 NOTE — Discharge Instructions (Signed)
Esophagogastroduodenoscopy, Care After Refer to this sheet in the next few weeks. These instructions provide you with information about caring for yourself after your procedure. Your health care provider may also give you more specific instructions. Your treatment has been planned according to current medical practices, but problems sometimes occur. Call your health care provider if you have any problems or questions after your procedure. What can I expect after the procedure? After the procedure, it is common to have:  A sore throat.  Nausea.  Bloating.  Dizziness.  Fatigue. Follow these instructions at home:  Do not eat or drink anything until the numbing medicine (local anesthetic) has worn off and your gag reflex has returned. You will know that the local anesthetic has worn off when you can swallow comfortably.  Do not drive for 24 hours if you received a medicine to help you relax (sedative).  If your health care provider took a tissue sample for testing during the procedure, make sure to get your test results. This is your responsibility. Ask your health care provider or the department performing the test when your results will be ready.  Keep all follow-up visits as told by your health care provider. This is important. Contact a health care provider if:  You cannot stop coughing.  You are not urinating.  You are urinating less than usual. Get help right away if:  You have trouble swallowing.  You cannot eat or drink.  You have throat or chest pain that gets worse.  You are dizzy or light-headed.  You faint.  You have nausea or vomiting.  You have chills.  You have a fever.  You have severe abdominal pain.  You have black, tarry, or bloody stools. This information is not intended to replace advice given to you by your health care provider. Make sure you discuss any questions you have with your health care provider. Document Released: 07/28/2012 Document  Revised: 01/17/2016 Document Reviewed: 07/05/2015 Elsevier Interactive Patient Education  2017 [employer] Colonoscopy, Adult, Care After This sheet gives you information about how to care for yourself after your procedure. Your doctor may also give you more specific instructions. If you have problems or questions, call your doctor. Follow these instructions at home: General instructions    For the first 24 hours after the procedure:  Do not drive or use machinery.  Do not sign important documents.  Do not drink alcohol.  Do your daily activities more slowly than normal.  Eat foods that are soft and easy to digest.  Rest often.  Take over-the-counter or prescription medicines only as told by your doctor.  It is up to you to get the results of your procedure. Ask your doctor, or the department performing the procedure, when your results will be ready. To help cramping and bloating:   Try walking around.  Put heat on your belly (abdomen) as told by your doctor. Use a heat source that your doctor recommends, such as a moist heat pack or a heating pad.  Put a towel between your skin and the heat source.  Leave the heat on for 20-30 minutes.  Remove the heat if your skin turns bright red. This is especially important if you cannot feel pain, heat, or cold. You can get burned. Eating and drinking   Drink enough fluid to keep your pee (urine) clear or pale yellow.  Return to your normal diet as told by your doctor. Avoid heavy or fried foods that are hard to digest.  Avoid drinking alcohol for as long as told by your doctor. Contact a doctor if:  You have blood in your poop (stool) 2-3 days after the procedure. Get help right away if:  You have more than a small amount of blood in your poop.  You see large clumps of tissue (blood clots) in your poop.  Your belly is swollen.  You feel sick to your stomach (nauseous).  You throw up (vomit).  You have a  fever.  You have belly pain that gets worse, and medicine does not help your pain. This information is not intended to replace advice given to you by your health care provider. Make sure you discuss any questions you have with your health care provider. Document Released: 09/13/2010 Document Revised: 05/05/2016 Document Reviewed: 05/05/2016 Elsevier Interactive Patient Education  2017 [employer].

## 2016-11-25 NOTE — Op Note (Signed)
[facility] Patient Name: Rebecca Charles Procedure Date: 11/25/2016 MRN: [id number] Attending MD: Garlan Fair , MD Date of Birth: 04-17-1951 CSN: [id number] Age: 66 Admit Type: Outpatient Procedure:                EGD with Azzie Almas Dilation Under Fluro Indications:              Heartburn Providers:                Garlan Fair, MD, Carolynn Comment, RN,                            William Dalton, Technician Referring MD:              Medicines:                Propofol per Anesthesia Complications:            No immediate complications. Estimated Blood Loss:     Estimated blood loss was minimal. Procedure:                Pre-Anesthesia Assessment:                           - Prior to the procedure, a History and Physical                            was performed, and patient medications and                            allergies were reviewed. The patient's tolerance of                            previous anesthesia was also reviewed. The risks                            and benefits of the procedure and the sedation                            options and risks were discussed with the patient.                            All questions were answered, and informed consent                            was obtained. Prior Anticoagulants: The patient has                            taken no previous anticoagulant or antiplatelet                            agents. ASA Grade Assessment: II - A patient with                            mild systemic disease. After reviewing the risks  and benefits, the patient was deemed in                            satisfactory condition to undergo the procedure.                           - Prior to the procedure, a History and Physical                            was performed, and patient medications and                            allergies were reviewed. The patient's tolerance of                            previous  anesthesia was also reviewed. The risks                            and benefits of the procedure and the sedation                            options and risks were discussed with the patient.                            All questions were answered, and informed consent                            was obtained. Prior Anticoagulants: The patient has                            taken no previous anticoagulant or antiplatelet                            agents. ASA Grade Assessment: II - A patient with                            mild systemic disease. After reviewing the risks                            and benefits, the patient was deemed in                            satisfactory condition to undergo the procedure.                           After obtaining informed consent, the endoscope was                            passed under direct vision. Throughout the                            procedure, the patient's blood pressure, pulse, and  oxygen saturations were monitored continuously. The                            [id number] ([id number]) scope was introduced through the                            mouth, and advanced to the second part of duodenum.                            The upper GI endoscopy was accomplished without                            difficulty. The patient tolerated the procedure                            well. Scope In: Scope Out: Findings:      The Z-line was regular and was found 40 cm from the incisors.      The examined esophagus was normal.      The entire examined stomach was normal except for the removal of foue       2-3 mm sized sessile gastric body polyps with the cold biopsy forceps..      The examined duodenum was normal. Impression:               - Z-line regular, 40 cm from the incisors.                           - Normal esophagus.                           - Normal stomach.                           - Normal examined duodenum.                            - No specimens collected. Moderate Sedation:      N/A- Per Anesthesia Care Recommendation:           - Patient has a contact number available for                            emergencies. The signs and symptoms of potential                            delayed complications were discussed with the                            patient. Return to normal activities tomorrow.                            Written discharge instructions were provided to the                            patient.                           -  Await pathology results. Procedure Code(s):        --- Professional ---                           [phone], Esophagogastroduodenoscopy, flexible,                            transoral; diagnostic, including collection of                            specimen(s) by brushing or washing, when performed                            (separate procedure) Diagnosis Code(s):        --- Professional ---                           R12, Heartburn CPT copyright 2016 American Medical Association. All rights reserved. The codes documented in this report are preliminary and upon coder review may  be revised to meet current compliance requirements. Earle Gell, MD Garlan Fair, MD 11/25/2016 1:25:34 PM This report has been signed electronically. Number of Addenda: 0

## 2016-11-25 NOTE — Transfer of Care (Signed)
Immediate Anesthesia Transfer of Care Note  Patient: AINARA ELDRIDGE  Procedure(s) Performed: Procedure(s): COLONOSCOPY WITH PROPOFOL (N/A) ESOPHAGOGASTRODUODENOSCOPY (EGD) WITH PROPOFOL (N/A)  Patient Location: PACU and Endoscopy Unit  Anesthesia Type:MAC  Level of Consciousness: awake and alert   Airway & Oxygen Therapy: Patient Spontanous Breathing and Patient connected to nasal cannula oxygen  Post-op Assessment: Report given to RN and Post -op Vital signs reviewed and stable  Post vital signs: Reviewed and stable  Last Vitals:  Vitals:   11/25/16 1130  BP: (!) 181/59  Pulse: 72  Resp: 18  Temp: 36.7 C    Last Pain:  Vitals:   11/25/16 1130  TempSrc: Oral         Complications: No apparent anesthesia complications

## 2016-11-25 NOTE — Op Note (Signed)
[facility] Patient Name: Rebecca Charles Procedure Date: 11/25/2016 MRN: [id number] Attending MD: Garlan Fair , MD Date of Birth: 14-Jan-1951 CSN: [id number] Age: 66 Admit Type: Outpatient Procedure:                Colonoscopy Indications:              Screening for colorectal malignant neoplasm Providers:                Garlan Fair, MD, Carolynn Comment, RN,                            William Dalton, Technician Referring MD:              Medicines:                Propofol per Anesthesia Complications:            No immediate complications. Estimated Blood Loss:     Estimated blood loss: none. Procedure:                Pre-Anesthesia Assessment:                           - Prior to the procedure, a History and Physical                            was performed, and patient medications and                            allergies were reviewed. The patient's tolerance of                            previous anesthesia was also reviewed. The risks                            and benefits of the procedure and the sedation                            options and risks were discussed with the patient.                            All questions were answered, and informed consent                            was obtained. Prior Anticoagulants: The patient has                            taken no previous anticoagulant or antiplatelet                            agents. ASA Grade Assessment: II - A patient with                            mild systemic disease. After reviewing the risks  and benefits, the patient was deemed in                            satisfactory condition to undergo the procedure.                           After obtaining informed consent, the colonoscope                            was passed under direct vision. Throughout the                            procedure, the patient's blood pressure, pulse, and   oxygen saturations were monitored continuously. The                            [id number] ([id number]) scope was introduced through                            the anus and advanced to the the cecum, identified                            by appendiceal orifice and ileocecal valve. The                            colonoscopy was performed without difficulty. The                            patient tolerated the procedure well. The quality                            of the bowel preparation was good. The terminal                            ileum, the ileocecal valve, the appendiceal orifice                            and the rectum were photographed. Findings:      The perianal and digital rectal examinations were normal.      The entire examined colon appeared normal. Impression:               - The entire examined colon is normal.                           - No specimens collected. Moderate Sedation:      N/A- Per Anesthesia Care Recommendation:           - Patient has a contact number available for                            emergencies. The signs and symptoms of potential                            delayed complications were discussed with the  patient. Return to normal activities tomorrow.                            Written discharge instructions were provided to the                            patient.                           - Repeat colonoscopy in 10 years for screening                            purposes.                           - Resume previous diet.                           - Continue present medications. Procedure Code(s):        --- Professional ---                           B3435, Colorectal cancer screening; colonoscopy on                            individual not meeting criteria for high risk Diagnosis Code(s):        --- Professional ---                           Z12.11, Encounter for screening for malignant                            neoplasm of  colon CPT copyright 2016 American Medical Association. All rights reserved. The codes documented in this report are preliminary and upon coder review may  be revised to meet current compliance requirements. Earle Gell, MD Garlan Fair, MD 11/25/2016 1:20:58 PM This report has been signed electronically. Number of Addenda: 0

## 2016-11-25 NOTE — Anesthesia Preprocedure Evaluation (Addendum)
Anesthesia Evaluation  Patient identified by MRN, date of birth, ID band Patient awake    Reviewed: Allergy & Precautions, NPO status , Patient's Chart, lab work & pertinent test results  Airway Mallampati: I  TM Distance: >3 FB Neck ROM: Full    Dental  (+) Dental Advisory Given, Caps   Pulmonary    Pulmonary exam normal        Cardiovascular hypertension, Pt. on medications Normal cardiovascular exam     Neuro/Psych Anxiety    GI/Hepatic GERD  Medicated and Controlled,  Endo/Other    Renal/GU      Musculoskeletal   Abdominal   Peds  Hematology   Anesthesia Other Findings   Reproductive/Obstetrics                            Anesthesia Physical Anesthesia Plan  ASA: III  Anesthesia Plan: MAC   Post-op Pain Management:    Induction: Intravenous  Airway Management Planned: Simple Face Mask  Additional Equipment:   Intra-op Plan:   Post-operative Plan:   Informed Consent: I have reviewed the patients History and Physical, chart, labs and discussed the procedure including the risks, benefits and alternatives for the proposed anesthesia with the patient or authorized representative who has indicated his/her understanding and acceptance.     Plan Discussed with: CRNA and Surgeon  Anesthesia Plan Comments:         Anesthesia Quick Evaluation

## 2016-11-25 NOTE — H&P (Signed)
Procedure: Screening colonoscopy and diagnostic esophagogastroduodenoscopy. Chronic gastroesophageal reflux with breakthrough heartburn despite taking Nexium each morning. 11/23/2006 normal screening colonoscopy was performed. A 05/04/2008 esophagogastroduodenoscopy showed small gastric polyps :   History: The patient is a 66 year old female born 09-Feb-1951. She has chronic gastroesophageal reflux with breakthrough heartburn despite taking Nexium each morning. September 2009 esophagogastroduodenoscopy showed small foveolar hyperplastic polyps but no H. pylori gastritis. March 2008 she underwent a normal screening colonoscopy.  She is scheduled to undergo diagnostic esophagogastroduodenoscopy and screening colonoscopy today.  Past medical history: Hypertension. Gastroesophageal reflux. Osteoporosis. Interstitial cystitis. Cholecystectomy. Lumbar back surgery.  Medication allergies: Percocet and spironolactone caused skin rash  Exam: The patient is alert and lying comfortably on the endoscopy stretcher. Abdomen is soft and nontender to palpation. Lungs are clear to auscultation. Cardiac exam reveals a regular rhythm.  Plan: Proceed with diagnostic esophagogastroduodenoscopy followed by screening colonoscopy

## 2016-11-25 NOTE — Anesthesia Postprocedure Evaluation (Signed)
Anesthesia Post Note  Patient: Rebecca Charles  Procedure(s) Performed: Procedure(s) (LRB): COLONOSCOPY WITH PROPOFOL (N/A) ESOPHAGOGASTRODUODENOSCOPY (EGD) WITH PROPOFOL (N/A)  Patient location during evaluation: PACU Anesthesia Type: MAC Level of consciousness: awake and alert Pain management: pain level controlled Vital Signs Assessment: post-procedure vital signs reviewed and stable Respiratory status: spontaneous breathing, nonlabored ventilation, respiratory function stable and patient connected to nasal cannula oxygen Cardiovascular status: stable and blood pressure returned to baseline Anesthetic complications: no       Last Vitals:  Vitals:   11/25/16 1130 11/25/16 1322  BP: (!) 181/59 (!) 145/64  Pulse: 72   Resp: 18 18  Temp: 36.7 C 36.4 C    Last Pain:  Vitals:   11/25/16 1322  TempSrc: Oral                 Jaceyon Strole DAVID

## 2016-11-26 ENCOUNTER — Encounter (HOSPITAL_COMMUNITY): Payer: Self-pay | Admitting: Gastroenterology

## 2017-02-23 DIAGNOSIS — N390 Urinary tract infection, site not specified: Secondary | ICD-10-CM | POA: Diagnosis not present

## 2017-03-04 DIAGNOSIS — I10 Essential (primary) hypertension: Secondary | ICD-10-CM | POA: Diagnosis not present

## 2017-03-04 DIAGNOSIS — M810 Age-related osteoporosis without current pathological fracture: Secondary | ICD-10-CM | POA: Diagnosis not present

## 2017-03-04 DIAGNOSIS — K219 Gastro-esophageal reflux disease without esophagitis: Secondary | ICD-10-CM | POA: Diagnosis not present

## 2017-03-04 DIAGNOSIS — E559 Vitamin D deficiency, unspecified: Secondary | ICD-10-CM | POA: Diagnosis not present

## 2017-03-04 DIAGNOSIS — J309 Allergic rhinitis, unspecified: Secondary | ICD-10-CM | POA: Diagnosis not present

## 2017-05-20 ENCOUNTER — Other Ambulatory Visit: Payer: Self-pay | Admitting: Obstetrics and Gynecology

## 2017-05-20 DIAGNOSIS — Z1231 Encounter for screening mammogram for malignant neoplasm of breast: Secondary | ICD-10-CM

## 2017-05-27 ENCOUNTER — Ambulatory Visit
Admission: RE | Admit: 2017-05-27 | Discharge: 2017-05-27 | Disposition: A | Discharge status: Home / Self Care | Payer: BC Managed Care – PPO | Source: Ambulatory Visit | Attending: Obstetrics and Gynecology | Admitting: Obstetrics and Gynecology

## 2017-05-27 DIAGNOSIS — Z1231 Encounter for screening mammogram for malignant neoplasm of breast: Secondary | ICD-10-CM

## 2017-07-07 DIAGNOSIS — Z6822 Body mass index (BMI) 22.0-22.9, adult: Secondary | ICD-10-CM | POA: Diagnosis not present

## 2017-07-07 DIAGNOSIS — Z01419 Encounter for gynecological examination (general) (routine) without abnormal findings: Secondary | ICD-10-CM | POA: Diagnosis not present

## 2017-07-08 DIAGNOSIS — M47816 Spondylosis without myelopathy or radiculopathy, lumbar region: Secondary | ICD-10-CM | POA: Diagnosis not present

## 2017-07-08 DIAGNOSIS — M545 Low back pain: Secondary | ICD-10-CM | POA: Diagnosis not present

## 2017-07-27 DIAGNOSIS — Z1283 Encounter for screening for malignant neoplasm of skin: Secondary | ICD-10-CM | POA: Diagnosis not present

## 2017-07-27 DIAGNOSIS — L578 Other skin changes due to chronic exposure to nonionizing radiation: Secondary | ICD-10-CM | POA: Diagnosis not present

## 2017-07-27 DIAGNOSIS — L820 Inflamed seborrheic keratosis: Secondary | ICD-10-CM | POA: Diagnosis not present

## 2017-07-27 DIAGNOSIS — D229 Melanocytic nevi, unspecified: Secondary | ICD-10-CM | POA: Diagnosis not present

## 2017-07-27 DIAGNOSIS — I8393 Asymptomatic varicose veins of bilateral lower extremities: Secondary | ICD-10-CM | POA: Diagnosis not present

## 2017-07-27 DIAGNOSIS — D485 Neoplasm of uncertain behavior of skin: Secondary | ICD-10-CM | POA: Diagnosis not present

## 2017-07-27 DIAGNOSIS — L812 Freckles: Secondary | ICD-10-CM | POA: Diagnosis not present

## 2017-07-27 DIAGNOSIS — L570 Actinic keratosis: Secondary | ICD-10-CM | POA: Diagnosis not present

## 2017-07-27 DIAGNOSIS — Z85828 Personal history of other malignant neoplasm of skin: Secondary | ICD-10-CM | POA: Diagnosis not present

## 2017-07-27 DIAGNOSIS — L821 Other seborrheic keratosis: Secondary | ICD-10-CM | POA: Diagnosis not present

## 2017-07-27 DIAGNOSIS — H61002 Unspecified perichondritis of left external ear: Secondary | ICD-10-CM | POA: Diagnosis not present

## 2017-07-27 DIAGNOSIS — D1801 Hemangioma of skin and subcutaneous tissue: Secondary | ICD-10-CM | POA: Diagnosis not present

## 2017-07-29 ENCOUNTER — Other Ambulatory Visit: Payer: Self-pay | Admitting: Sports Medicine

## 2017-07-29 DIAGNOSIS — M5416 Radiculopathy, lumbar region: Secondary | ICD-10-CM

## 2017-07-29 DIAGNOSIS — M545 Low back pain: Secondary | ICD-10-CM

## 2017-07-29 DIAGNOSIS — M25562 Pain in left knee: Secondary | ICD-10-CM | POA: Diagnosis not present

## 2017-08-09 ENCOUNTER — Ambulatory Visit
Admission: RE | Admit: 2017-08-09 | Discharge: 2017-08-09 | Disposition: A | Discharge status: Home / Self Care | Payer: BC Managed Care – PPO | Source: Ambulatory Visit | Attending: Sports Medicine | Admitting: Sports Medicine

## 2017-08-09 DIAGNOSIS — M5416 Radiculopathy, lumbar region: Secondary | ICD-10-CM

## 2017-08-09 DIAGNOSIS — M545 Low back pain: Secondary | ICD-10-CM

## 2017-08-09 DIAGNOSIS — M5126 Other intervertebral disc displacement, lumbar region: Secondary | ICD-10-CM | POA: Diagnosis not present

## 2017-08-09 MED ORDER — GADOBENATE DIMEGLUMINE 529 MG/ML IV SOLN
12.0000 mL | Freq: Once | INTRAVENOUS | Status: AC | PRN
  Administered 2017-08-09: 12 mL via INTRAVENOUS

## 2017-08-12 ENCOUNTER — Other Ambulatory Visit: Payer: Self-pay | Admitting: Sports Medicine

## 2017-08-12 DIAGNOSIS — M48061 Spinal stenosis, lumbar region without neurogenic claudication: Secondary | ICD-10-CM

## 2017-08-27 ENCOUNTER — Other Ambulatory Visit: Payer: Self-pay | Admitting: Sports Medicine

## 2017-08-27 ENCOUNTER — Ambulatory Visit
Admission: RE | Admit: 2017-08-27 | Discharge: 2017-08-27 | Disposition: A | Discharge status: Home / Self Care | Payer: BC Managed Care – PPO | Source: Ambulatory Visit | Attending: Sports Medicine | Admitting: Sports Medicine

## 2017-08-27 DIAGNOSIS — M48061 Spinal stenosis, lumbar region without neurogenic claudication: Secondary | ICD-10-CM

## 2017-08-27 DIAGNOSIS — M5416 Radiculopathy, lumbar region: Secondary | ICD-10-CM | POA: Diagnosis not present

## 2017-08-27 MED ORDER — METHYLPREDNISOLONE ACETATE 40 MG/ML INJ SUSP (RADIOLOG
120.0000 mg | Freq: Once | INTRAMUSCULAR | Status: AC
  Administered 2017-08-27: 120 mg via EPIDURAL

## 2017-08-27 MED ORDER — IOPAMIDOL (ISOVUE-M 200) INJECTION 41%
1.0000 mL | Freq: Once | INTRAMUSCULAR | Status: AC
  Administered 2017-08-27: 1 mL via EPIDURAL

## 2017-08-27 NOTE — Discharge Instructions (Signed)

## 2017-09-15 DIAGNOSIS — M810 Age-related osteoporosis without current pathological fracture: Secondary | ICD-10-CM | POA: Diagnosis not present

## 2017-09-15 DIAGNOSIS — Z1389 Encounter for screening for other disorder: Secondary | ICD-10-CM | POA: Diagnosis not present

## 2017-09-15 DIAGNOSIS — I10 Essential (primary) hypertension: Secondary | ICD-10-CM | POA: Diagnosis not present

## 2017-09-15 DIAGNOSIS — K219 Gastro-esophageal reflux disease without esophagitis: Secondary | ICD-10-CM | POA: Diagnosis not present

## 2017-09-15 DIAGNOSIS — Z0000 Encounter for general adult medical examination without abnormal findings: Secondary | ICD-10-CM | POA: Diagnosis not present

## 2017-09-15 DIAGNOSIS — R7309 Other abnormal glucose: Secondary | ICD-10-CM | POA: Diagnosis not present

## 2017-09-15 DIAGNOSIS — F419 Anxiety disorder, unspecified: Secondary | ICD-10-CM | POA: Diagnosis not present

## 2017-09-15 DIAGNOSIS — M519 Unspecified thoracic, thoracolumbar and lumbosacral intervertebral disc disorder: Secondary | ICD-10-CM | POA: Diagnosis not present

## 2017-09-15 DIAGNOSIS — M5090 Cervical disc disorder, unspecified, unspecified cervical region: Secondary | ICD-10-CM | POA: Diagnosis not present

## 2017-09-15 DIAGNOSIS — J309 Allergic rhinitis, unspecified: Secondary | ICD-10-CM | POA: Diagnosis not present

## 2017-09-15 DIAGNOSIS — G250 Essential tremor: Secondary | ICD-10-CM | POA: Diagnosis not present

## 2017-09-15 DIAGNOSIS — N3010 Interstitial cystitis (chronic) without hematuria: Secondary | ICD-10-CM | POA: Diagnosis not present

## 2017-12-12 ENCOUNTER — Encounter (HOSPITAL_COMMUNITY): Payer: Self-pay | Admitting: *Deleted

## 2017-12-12 ENCOUNTER — Ambulatory Visit (HOSPITAL_COMMUNITY)
Admission: EM | Admit: 2017-12-12 | Discharge: 2017-12-12 | Disposition: A | Discharge status: Home / Self Care | Payer: BC Managed Care – PPO | Attending: Family Medicine | Admitting: Family Medicine

## 2017-12-12 ENCOUNTER — Other Ambulatory Visit: Payer: Self-pay

## 2017-12-12 DIAGNOSIS — N3000 Acute cystitis without hematuria: Secondary | ICD-10-CM | POA: Diagnosis not present

## 2017-12-12 HISTORY — DX: Interstitial cystitis (chronic) without hematuria: N30.10

## 2017-12-12 HISTORY — DX: Dorsalgia, unspecified: M54.9

## 2017-12-12 LAB — POCT URINALYSIS DIP (DEVICE)
Bilirubin Urine: NEGATIVE
Glucose, UA: NEGATIVE mg/dL
Ketones, ur: NEGATIVE mg/dL
Nitrite: NEGATIVE
Protein, ur: NEGATIVE mg/dL
Specific Gravity, Urine: 1.015 (ref 1.005–1.030)
Urobilinogen, UA: 0.2 mg/dL (ref 0.0–1.0)
pH: 6 (ref 5.0–8.0)

## 2017-12-12 MED ORDER — SULFAMETHOXAZOLE-TRIMETHOPRIM 800-160 MG PO TABS: 1.0000 | ORAL_TABLET | Freq: Two times a day (BID) | ORAL | 0 refills | Status: AC

## 2017-12-12 NOTE — ED Triage Notes (Signed)
Reports dysuria, polyuria and slight hematuria since this AM with suprapubic pain.

## 2017-12-12 NOTE — ED Provider Notes (Signed)
  [name]    CSN: [id number] Arrival date & time: 12/12/17  1701  Chief Complaint  Patient presents with  . Dysuria    Rebecca Charles is a 67 y.o. female here for possible UTI.  Duration: 2 days. Symptoms: urinary frequency, hematuria and dysuria and pelvic pain Denies: urinary hesitancy, urinary retention, fever and flank pain, vaginal discharge Hx of recurrent UTI? No Denies new sexual partners.  ROS:  Constitutional: denies fever GU: As noted in HPI  Past Medical History:  Diagnosis Date  . Anxiety   . Back pain   . Diverticulitis   . DJD (degenerative joint disease)    neck and lower back  . GERD (gastroesophageal reflux disease)   . Headache    hx migraines in past  . Hypertension   . IBS (irritable bowel syndrome)   . IC (interstitial cystitis)   . Interstitial cystitis   . Schamberg's purpura     BP (!) 149/59   Pulse 78   Temp 97.6 F (36.4 C) (Oral)   Resp 16   SpO2 97%  General: Awake, alert, appears stated age HEENT: MMM Heart: RRR, no murmurs Lungs: CTAB, normal respiratory effort, no accessory muscle usage Abd: BS+, soft, NT, ND, no masses or organomegaly MSK: No CVA tenderness, neg Lloyd's sign Psych: Age appropriate judgment and insight  Acute cystitis without hematuria  Bactrim DS bid, 3 d. Cx to follow. Stay hydrated. Seek immediate care if pt starts to develop fevers, new/worsening symptoms, uncontrollable N/V. F/u prn. The patient voiced understanding and agreement to the plan.    Shelda Pal, [place] 12/12/17 Bosie Helper

## 2017-12-15 DIAGNOSIS — N390 Urinary tract infection, site not specified: Secondary | ICD-10-CM | POA: Diagnosis not present

## 2017-12-15 DIAGNOSIS — K219 Gastro-esophageal reflux disease without esophagitis: Secondary | ICD-10-CM | POA: Diagnosis not present

## 2017-12-15 DIAGNOSIS — R05 Cough: Secondary | ICD-10-CM | POA: Diagnosis not present

## 2018-03-15 DIAGNOSIS — I10 Essential (primary) hypertension: Secondary | ICD-10-CM | POA: Diagnosis not present

## 2018-03-15 DIAGNOSIS — N3010 Interstitial cystitis (chronic) without hematuria: Secondary | ICD-10-CM | POA: Diagnosis not present

## 2018-03-15 DIAGNOSIS — J309 Allergic rhinitis, unspecified: Secondary | ICD-10-CM | POA: Diagnosis not present

## 2018-03-15 DIAGNOSIS — K219 Gastro-esophageal reflux disease without esophagitis: Secondary | ICD-10-CM | POA: Diagnosis not present

## 2018-03-15 DIAGNOSIS — R51 Headache: Secondary | ICD-10-CM | POA: Diagnosis not present

## 2018-06-02 ENCOUNTER — Other Ambulatory Visit: Payer: Self-pay | Admitting: Obstetrics and Gynecology

## 2018-06-02 DIAGNOSIS — Z1231 Encounter for screening mammogram for malignant neoplasm of breast: Secondary | ICD-10-CM

## 2018-06-08 ENCOUNTER — Ambulatory Visit
Admission: RE | Admit: 2018-06-08 | Discharge: 2018-06-08 | Disposition: A | Discharge status: Home / Self Care | Payer: BC Managed Care – PPO | Source: Ambulatory Visit | Attending: Obstetrics and Gynecology | Admitting: Obstetrics and Gynecology

## 2018-06-08 DIAGNOSIS — Z1231 Encounter for screening mammogram for malignant neoplasm of breast: Secondary | ICD-10-CM

## 2018-07-13 DIAGNOSIS — Z01419 Encounter for gynecological examination (general) (routine) without abnormal findings: Secondary | ICD-10-CM | POA: Diagnosis not present

## 2018-07-13 DIAGNOSIS — Z6822 Body mass index (BMI) 22.0-22.9, adult: Secondary | ICD-10-CM | POA: Diagnosis not present

## 2018-09-08 IMAGING — MR MR LUMBAR SPINE WO/W CM
4 of 7 series · 24 of 48 positions shown · IV contrast (multihance)
Comparison: CT Abdomen and Pelvis 08/07/2014. CT lumbar myelogram
06/01/2013. Lumbar MRI 03/23/2011

CLINICAL DATA: 66-year-old female with lumbar back pain radiating
to both hips and legs left greater than right. Associated weakness
and numbness. Symptoms for several months. No known injury. Prior
surgery.

Creatinine was obtained on site at [HOSPITAL] at [HOSPITAL].
Results: Creatinine 0.8 mg/dL.
EXAM:
MRI LUMBAR SPINE WITHOUT AND WITH CONTRAST
TECHNIQUE: Multiplanar and multiecho pulse sequences of the lumbar spine were
obtained without and with intravenous contrast.
CONTRAST:  12mL MULTIHANCE GADOBENATE DIMEGLUMINE 529 MG/ML IV SOLN

[Series 4: T1 · sagittal · 4.0mm · 0.55mm/px · 5 of 15 slices shown (1 of 2)]
[im 1/15]
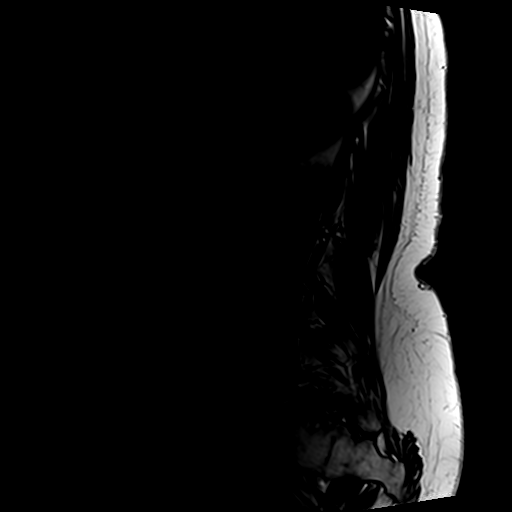
[im 4/15]
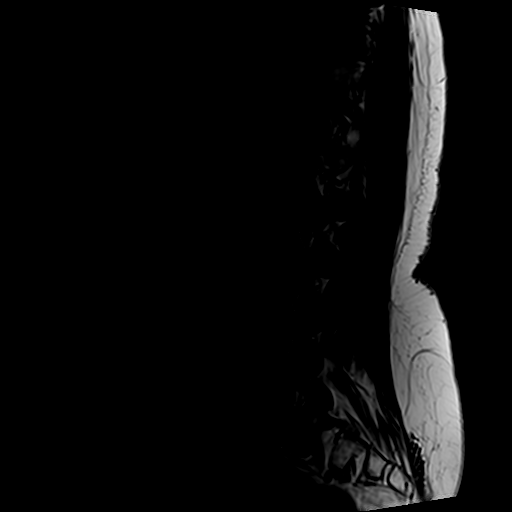
[im 8/15]
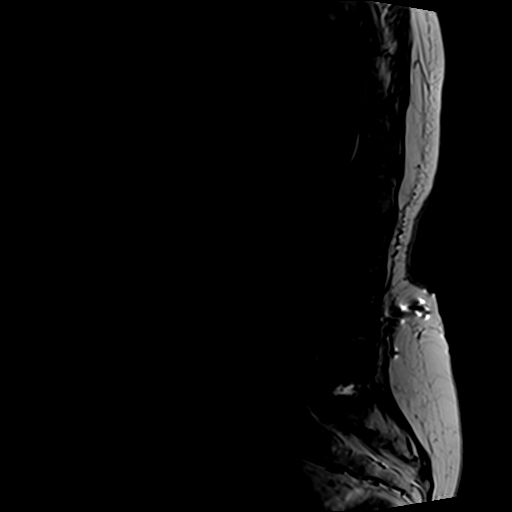
[im 11/15]
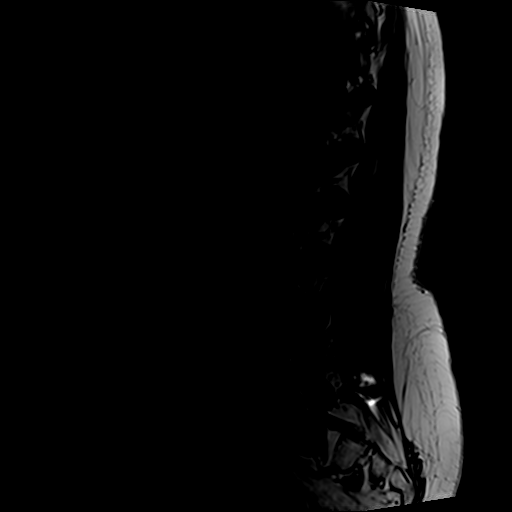
[im 15/15]
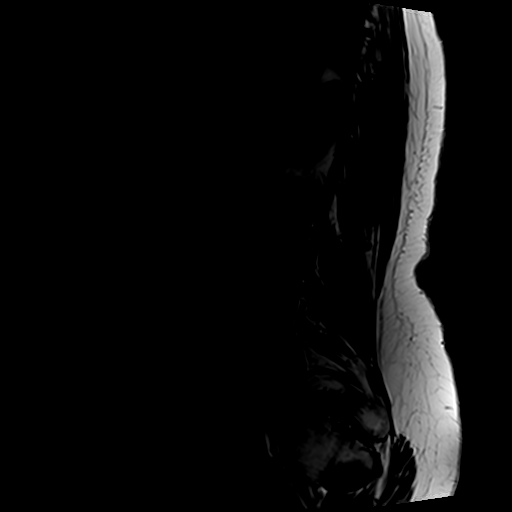

[Series 5: T1 · axial · 4.0mm · 0.35mm/px · z∈[-82,+106]mm · 7 of 37 slices shown (2 of 2)]
[im 1/37]
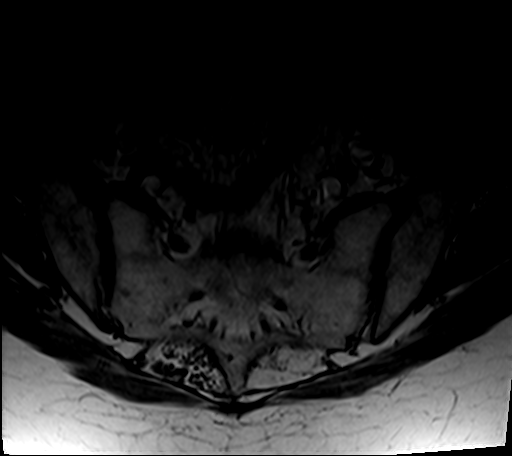
[im 5/37]
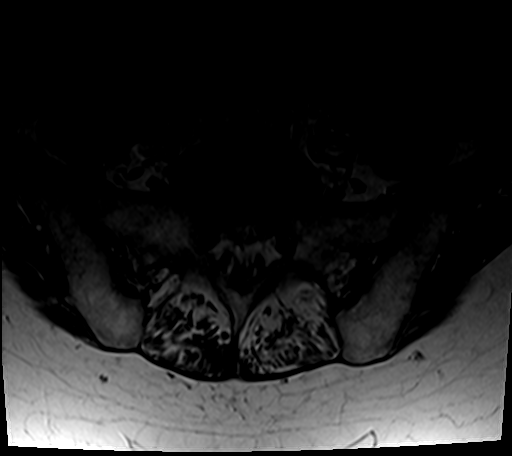
[im 13/37]
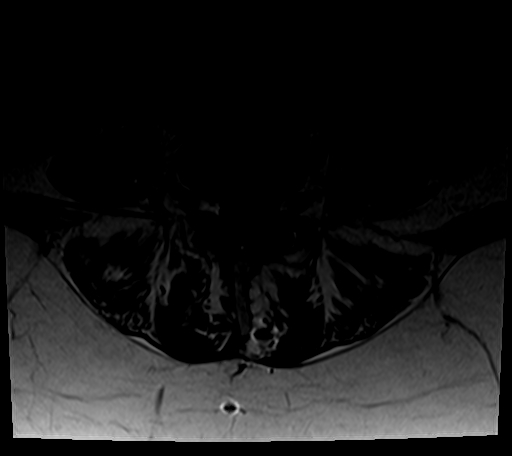
[im 17/37]
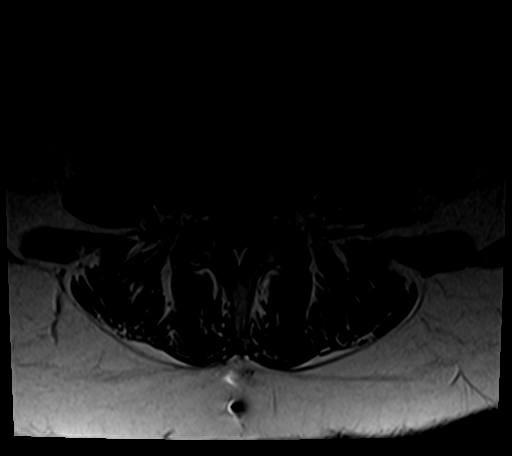
[im 21/37]
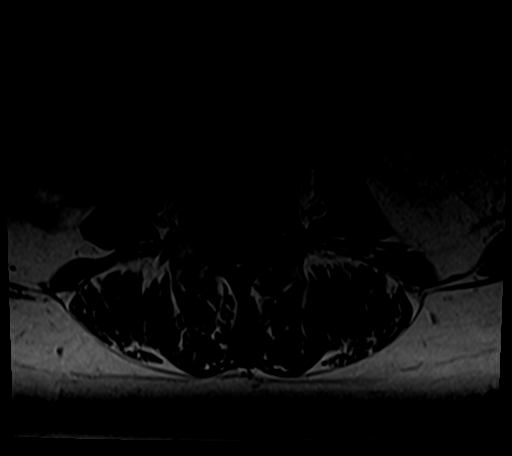
[im 25/37]
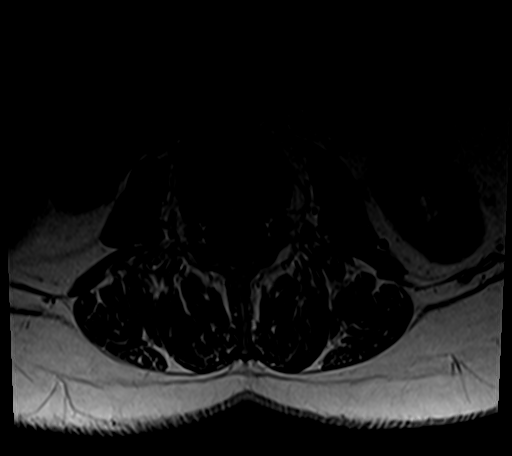
[im 33/37]
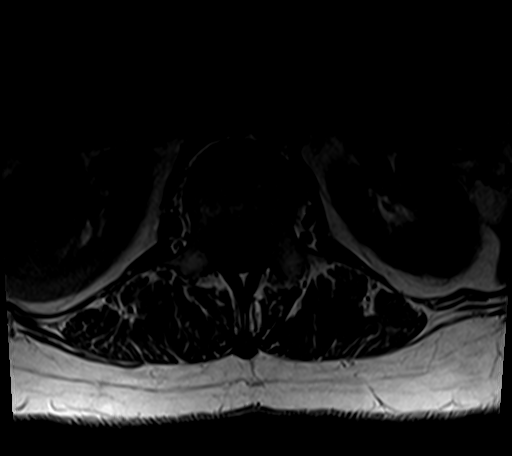

[Series 6: T2 · axial · 4.0mm · 0.70mm/px · z∈[-82,+127]mm · 8 of 37 slices shown]
[im 1/37]
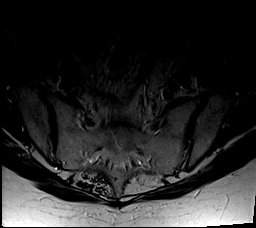
[im 5/37]
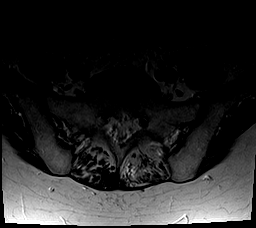
[im 13/37]
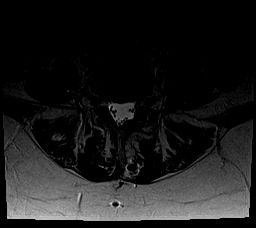
[im 17/37]
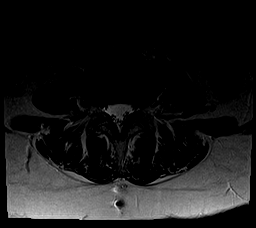
[im 21/37]
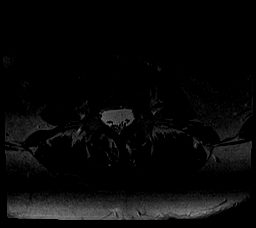
[im 25/37]
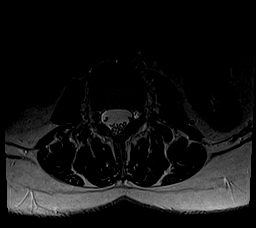
[im 33/37]
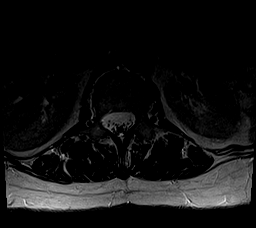
[im 37/37]
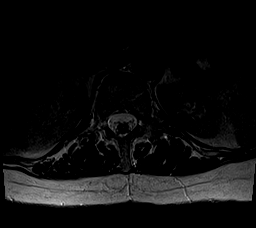

[Series 7: T2 post-contrast · sagittal · 4.0mm · 0.55mm/px · 4 of 15 slices shown]
[im 1/15]
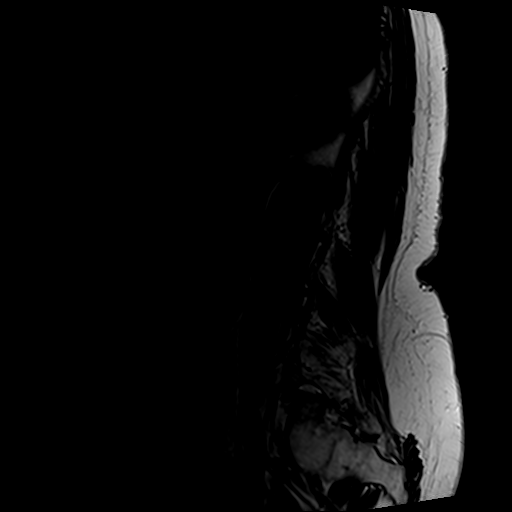
[im 5/15]
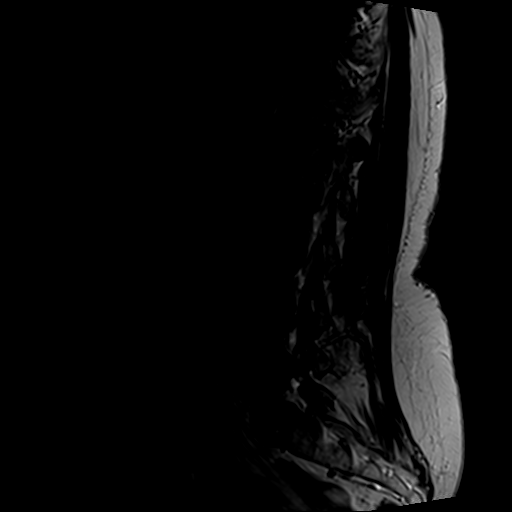
[im 10/15]
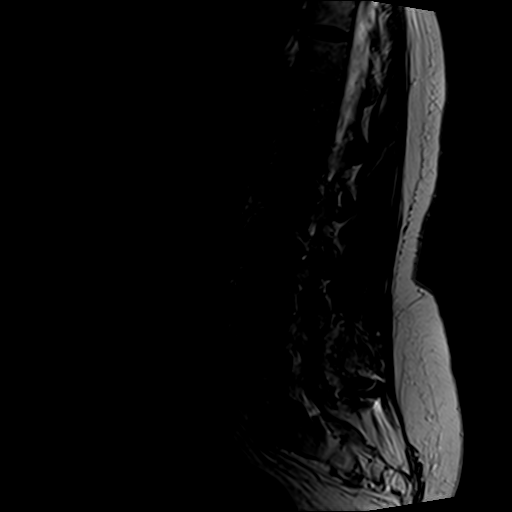
[im 15/15]
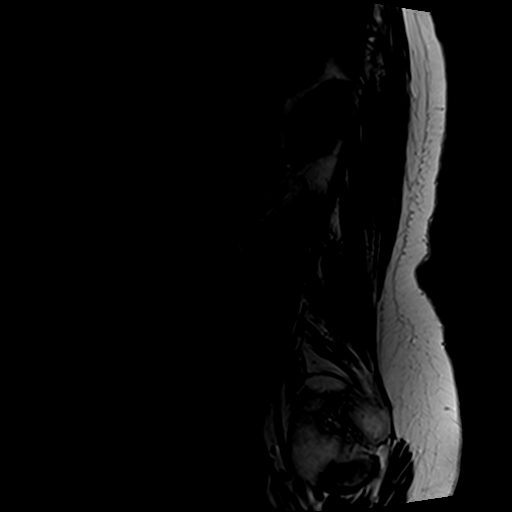

[24 of 48 positions shown; findings below may reference images not displayed]

FINDINGS: Segmentation: Normal is demonstrated on the CT lumbar myelogram and
this is the same numbering system used on the 2352 MRI.

Alignment: Mild straightening of lumbar lordosis. No significant
change in vertebral height or alignment since 2352.

Vertebrae: No marrow edema or evidence of acute osseous abnormality.
Visualized bone marrow signal is within normal limits. Intact
visible sacrum and SI joints.

Conus medullaris and cauda equina: Conus extends to the L1 level.
Conus and cauda equina appear normal. No abnormal intradural
enhancement.

Paraspinal and other soft tissues: Negative visualized abdominal
viscera. Mild postoperative changes to the posterior paraspinal soft
tissues at the L4 and L5 levels. Mild associated susceptibility
artifact. No postoperative fluid collection.

Disc levels:

No lower thoracic spinal stenosis.

T12-L1:  Negative.

L1-L2: Mild disc desiccation and circumferential disc bulge not
significantly changed since 2352. Borderline to mild left L1
foraminal stenosis.

L2-L3: Mild disc desiccation with circumferential but mostly far
lateral disc bulging. Borderline to mild facet hypertrophy. No
significant stenosis.

L3-L4: Disc desiccation with mild circumferential but mostly far
lateral disc bulging. Mild facet and ligament flavum hypertrophy.
This level is stable without significant stenosis.

L4-L5: Chronic postoperative changes to the left lamina. Chronic
disc desiccation and disc space loss with circumferential disc
bulge. Postoperative changes to the disc suspected at the left
lateral recess. Mild residual facet and ligament flavum hypertrophy.
No lateral recess or spinal stenosis. Bilateral broad-based
foraminal disc and endplate spurring is chronic. Borderline to mild
bilateral L4 neural foraminal stenosis has not significantly
changed.

L5-S1:  Stable mild facet hypertrophy.
IMPRESSION: 1. MRI appearance of the lumbar spine has not significantly changed
since 2352. No acute osseous abnormality.
2. Stable postoperative appearance of the L4-L5 level. Up to mild
bilateral L4 neural foraminal stenosis is related to broad-based
foraminal disc and endplate spurring.
3. Mild for age lumbar spine degeneration elsewhere without
convincing neural impingement.

## 2018-09-16 DIAGNOSIS — I10 Essential (primary) hypertension: Secondary | ICD-10-CM | POA: Diagnosis not present

## 2018-09-16 DIAGNOSIS — J309 Allergic rhinitis, unspecified: Secondary | ICD-10-CM | POA: Diagnosis not present

## 2018-09-16 DIAGNOSIS — Z0000 Encounter for general adult medical examination without abnormal findings: Secondary | ICD-10-CM | POA: Diagnosis not present

## 2018-09-16 DIAGNOSIS — N3010 Interstitial cystitis (chronic) without hematuria: Secondary | ICD-10-CM | POA: Diagnosis not present

## 2018-09-16 DIAGNOSIS — F419 Anxiety disorder, unspecified: Secondary | ICD-10-CM | POA: Diagnosis not present

## 2018-09-16 DIAGNOSIS — K219 Gastro-esophageal reflux disease without esophagitis: Secondary | ICD-10-CM | POA: Diagnosis not present

## 2018-09-16 DIAGNOSIS — E559 Vitamin D deficiency, unspecified: Secondary | ICD-10-CM | POA: Diagnosis not present

## 2018-09-16 DIAGNOSIS — M810 Age-related osteoporosis without current pathological fracture: Secondary | ICD-10-CM | POA: Diagnosis not present

## 2018-09-16 DIAGNOSIS — R7309 Other abnormal glucose: Secondary | ICD-10-CM | POA: Diagnosis not present

## 2018-10-21 DIAGNOSIS — L812 Freckles: Secondary | ICD-10-CM | POA: Diagnosis not present

## 2018-10-21 DIAGNOSIS — L570 Actinic keratosis: Secondary | ICD-10-CM | POA: Diagnosis not present

## 2018-10-21 DIAGNOSIS — D1800 Hemangioma unspecified site: Secondary | ICD-10-CM | POA: Diagnosis not present

## 2018-10-21 DIAGNOSIS — D225 Melanocytic nevi of trunk: Secondary | ICD-10-CM | POA: Diagnosis not present

## 2018-10-21 DIAGNOSIS — D2230 Melanocytic nevi of unspecified part of face: Secondary | ICD-10-CM | POA: Diagnosis not present

## 2018-10-21 DIAGNOSIS — Z85828 Personal history of other malignant neoplasm of skin: Secondary | ICD-10-CM | POA: Diagnosis not present

## 2018-10-21 DIAGNOSIS — Z1283 Encounter for screening for malignant neoplasm of skin: Secondary | ICD-10-CM | POA: Diagnosis not present

## 2018-10-21 DIAGNOSIS — L821 Other seborrheic keratosis: Secondary | ICD-10-CM | POA: Diagnosis not present

## 2018-10-21 DIAGNOSIS — D229 Melanocytic nevi, unspecified: Secondary | ICD-10-CM | POA: Diagnosis not present

## 2018-10-21 DIAGNOSIS — L820 Inflamed seborrheic keratosis: Secondary | ICD-10-CM | POA: Diagnosis not present

## 2018-10-21 DIAGNOSIS — L578 Other skin changes due to chronic exposure to nonionizing radiation: Secondary | ICD-10-CM | POA: Diagnosis not present

## 2018-10-21 DIAGNOSIS — D485 Neoplasm of uncertain behavior of skin: Secondary | ICD-10-CM | POA: Diagnosis not present

## 2019-02-01 DIAGNOSIS — H18413 Arcus senilis, bilateral: Secondary | ICD-10-CM | POA: Diagnosis not present

## 2019-02-01 DIAGNOSIS — H25043 Posterior subcapsular polar age-related cataract, bilateral: Secondary | ICD-10-CM | POA: Diagnosis not present

## 2019-02-01 DIAGNOSIS — H2512 Age-related nuclear cataract, left eye: Secondary | ICD-10-CM | POA: Diagnosis not present

## 2019-02-01 DIAGNOSIS — H2513 Age-related nuclear cataract, bilateral: Secondary | ICD-10-CM | POA: Diagnosis not present

## 2019-02-01 DIAGNOSIS — H25013 Cortical age-related cataract, bilateral: Secondary | ICD-10-CM | POA: Diagnosis not present

## 2019-02-11 DIAGNOSIS — H2512 Age-related nuclear cataract, left eye: Secondary | ICD-10-CM | POA: Diagnosis not present

## 2019-02-28 DIAGNOSIS — H2511 Age-related nuclear cataract, right eye: Secondary | ICD-10-CM | POA: Diagnosis not present

## 2019-03-18 DIAGNOSIS — K219 Gastro-esophageal reflux disease without esophagitis: Secondary | ICD-10-CM | POA: Diagnosis not present

## 2019-03-18 DIAGNOSIS — J309 Allergic rhinitis, unspecified: Secondary | ICD-10-CM | POA: Diagnosis not present

## 2019-03-18 DIAGNOSIS — R51 Headache: Secondary | ICD-10-CM | POA: Diagnosis not present

## 2019-03-18 DIAGNOSIS — I10 Essential (primary) hypertension: Secondary | ICD-10-CM | POA: Diagnosis not present

## 2019-06-08 ENCOUNTER — Other Ambulatory Visit: Payer: Self-pay | Admitting: Obstetrics and Gynecology

## 2019-06-08 DIAGNOSIS — Z1231 Encounter for screening mammogram for malignant neoplasm of breast: Secondary | ICD-10-CM

## 2019-06-13 ENCOUNTER — Ambulatory Visit
Admission: RE | Admit: 2019-06-13 | Discharge: 2019-06-13 | Disposition: A | Discharge status: Home / Self Care | Payer: BC Managed Care – PPO | Source: Ambulatory Visit | Attending: Obstetrics and Gynecology | Admitting: Obstetrics and Gynecology

## 2019-06-13 ENCOUNTER — Other Ambulatory Visit: Payer: Self-pay

## 2019-06-13 DIAGNOSIS — Z1231 Encounter for screening mammogram for malignant neoplasm of breast: Secondary | ICD-10-CM

## 2019-07-27 DIAGNOSIS — Z01419 Encounter for gynecological examination (general) (routine) without abnormal findings: Secondary | ICD-10-CM | POA: Diagnosis not present

## 2019-07-27 DIAGNOSIS — Z6822 Body mass index (BMI) 22.0-22.9, adult: Secondary | ICD-10-CM | POA: Diagnosis not present

## 2019-07-27 DIAGNOSIS — N952 Postmenopausal atrophic vaginitis: Secondary | ICD-10-CM | POA: Diagnosis not present

## 2019-11-02 DIAGNOSIS — Z85828 Personal history of other malignant neoplasm of skin: Secondary | ICD-10-CM | POA: Diagnosis not present

## 2019-11-02 DIAGNOSIS — L570 Actinic keratosis: Secondary | ICD-10-CM | POA: Diagnosis not present

## 2019-11-02 DIAGNOSIS — D2230 Melanocytic nevi of unspecified part of face: Secondary | ICD-10-CM | POA: Diagnosis not present

## 2019-11-02 DIAGNOSIS — L817 Pigmented purpuric dermatosis: Secondary | ICD-10-CM | POA: Diagnosis not present

## 2019-11-02 DIAGNOSIS — I781 Nevus, non-neoplastic: Secondary | ICD-10-CM | POA: Diagnosis not present

## 2019-11-02 DIAGNOSIS — D1801 Hemangioma of skin and subcutaneous tissue: Secondary | ICD-10-CM | POA: Diagnosis not present

## 2019-11-02 DIAGNOSIS — L578 Other skin changes due to chronic exposure to nonionizing radiation: Secondary | ICD-10-CM | POA: Diagnosis not present

## 2019-11-02 DIAGNOSIS — D229 Melanocytic nevi, unspecified: Secondary | ICD-10-CM | POA: Diagnosis not present

## 2019-11-02 DIAGNOSIS — L814 Other melanin hyperpigmentation: Secondary | ICD-10-CM | POA: Diagnosis not present

## 2019-11-02 DIAGNOSIS — L821 Other seborrheic keratosis: Secondary | ICD-10-CM | POA: Diagnosis not present

## 2019-11-02 DIAGNOSIS — D225 Melanocytic nevi of trunk: Secondary | ICD-10-CM | POA: Diagnosis not present

## 2019-11-02 DIAGNOSIS — Z1283 Encounter for screening for malignant neoplasm of skin: Secondary | ICD-10-CM | POA: Diagnosis not present

## 2019-12-20 DIAGNOSIS — H26491 Other secondary cataract, right eye: Secondary | ICD-10-CM | POA: Diagnosis not present

## 2019-12-21 ENCOUNTER — Other Ambulatory Visit: Payer: Self-pay | Admitting: Internal Medicine

## 2019-12-21 DIAGNOSIS — M810 Age-related osteoporosis without current pathological fracture: Secondary | ICD-10-CM

## 2020-01-05 DIAGNOSIS — H02831 Dermatochalasis of right upper eyelid: Secondary | ICD-10-CM | POA: Diagnosis not present

## 2020-01-05 DIAGNOSIS — H26491 Other secondary cataract, right eye: Secondary | ICD-10-CM | POA: Diagnosis not present

## 2020-01-05 DIAGNOSIS — Z961 Presence of intraocular lens: Secondary | ICD-10-CM | POA: Diagnosis not present

## 2020-01-05 DIAGNOSIS — H18413 Arcus senilis, bilateral: Secondary | ICD-10-CM | POA: Diagnosis not present

## 2020-01-19 DIAGNOSIS — H26492 Other secondary cataract, left eye: Secondary | ICD-10-CM | POA: Diagnosis not present

## 2020-06-06 DIAGNOSIS — Z23 Encounter for immunization: Secondary | ICD-10-CM | POA: Diagnosis not present

## 2020-06-18 ENCOUNTER — Other Ambulatory Visit: Payer: Self-pay | Admitting: Obstetrics and Gynecology

## 2020-06-18 DIAGNOSIS — Z1231 Encounter for screening mammogram for malignant neoplasm of breast: Secondary | ICD-10-CM

## 2020-06-19 ENCOUNTER — Other Ambulatory Visit: Payer: Self-pay

## 2020-06-19 ENCOUNTER — Ambulatory Visit
Admission: RE | Admit: 2020-06-19 | Discharge: 2020-06-19 | Disposition: A | Discharge status: Home / Self Care | Payer: BC Managed Care – PPO | Source: Ambulatory Visit | Attending: Internal Medicine | Admitting: Internal Medicine

## 2020-06-19 DIAGNOSIS — M810 Age-related osteoporosis without current pathological fracture: Secondary | ICD-10-CM

## 2020-07-25 ENCOUNTER — Ambulatory Visit
Admission: RE | Admit: 2020-07-25 | Discharge: 2020-07-25 | Disposition: A | Discharge status: Home / Self Care | Payer: BC Managed Care – PPO | Source: Ambulatory Visit | Attending: Obstetrics and Gynecology | Admitting: Obstetrics and Gynecology

## 2020-07-25 ENCOUNTER — Other Ambulatory Visit: Payer: Self-pay

## 2020-07-25 DIAGNOSIS — Z1231 Encounter for screening mammogram for malignant neoplasm of breast: Secondary | ICD-10-CM

## 2020-07-26 ENCOUNTER — Other Ambulatory Visit: Payer: Self-pay | Admitting: Obstetrics and Gynecology

## 2020-07-26 DIAGNOSIS — R928 Other abnormal and inconclusive findings on diagnostic imaging of breast: Secondary | ICD-10-CM

## 2020-07-28 ENCOUNTER — Ambulatory Visit
Admission: RE | Admit: 2020-07-28 | Discharge: 2020-07-28 | Disposition: A | Discharge status: Home / Self Care | Payer: BC Managed Care – PPO | Source: Ambulatory Visit | Attending: Obstetrics and Gynecology | Admitting: Obstetrics and Gynecology

## 2020-07-28 ENCOUNTER — Other Ambulatory Visit: Payer: Self-pay

## 2020-07-28 DIAGNOSIS — R928 Other abnormal and inconclusive findings on diagnostic imaging of breast: Secondary | ICD-10-CM

## 2020-07-28 DIAGNOSIS — R922 Inconclusive mammogram: Secondary | ICD-10-CM | POA: Diagnosis not present

## 2020-11-14 ENCOUNTER — Encounter: Payer: BC Managed Care – PPO | Admitting: Dermatology

## 2020-12-21 DIAGNOSIS — F419 Anxiety disorder, unspecified: Secondary | ICD-10-CM | POA: Diagnosis not present

## 2020-12-21 DIAGNOSIS — R519 Headache, unspecified: Secondary | ICD-10-CM | POA: Diagnosis not present

## 2020-12-21 DIAGNOSIS — Z0000 Encounter for general adult medical examination without abnormal findings: Secondary | ICD-10-CM | POA: Diagnosis not present

## 2020-12-21 DIAGNOSIS — I10 Essential (primary) hypertension: Secondary | ICD-10-CM | POA: Diagnosis not present

## 2021-01-19 DIAGNOSIS — N3001 Acute cystitis with hematuria: Secondary | ICD-10-CM | POA: Diagnosis not present

## 2021-06-05 DIAGNOSIS — Z1272 Encounter for screening for malignant neoplasm of vagina: Secondary | ICD-10-CM | POA: Diagnosis not present

## 2021-07-29 ENCOUNTER — Other Ambulatory Visit: Payer: Self-pay | Admitting: Obstetrics and Gynecology

## 2021-07-29 ENCOUNTER — Other Ambulatory Visit: Payer: Self-pay | Admitting: Internal Medicine

## 2021-07-29 DIAGNOSIS — Z1231 Encounter for screening mammogram for malignant neoplasm of breast: Secondary | ICD-10-CM

## 2021-09-02 ENCOUNTER — Other Ambulatory Visit: Payer: Self-pay

## 2021-09-02 ENCOUNTER — Ambulatory Visit
Admission: RE | Admit: 2021-09-02 | Discharge: 2021-09-02 | Disposition: A | Discharge status: Home / Self Care | Payer: Medicare Other | Source: Ambulatory Visit | Attending: Internal Medicine | Admitting: Internal Medicine

## 2021-09-02 DIAGNOSIS — Z1231 Encounter for screening mammogram for malignant neoplasm of breast: Secondary | ICD-10-CM

## 2021-09-09 ENCOUNTER — Ambulatory Visit: Payer: BC Managed Care – PPO | Admitting: Dermatology

## 2021-11-20 ENCOUNTER — Ambulatory Visit: Payer: BC Managed Care – PPO | Admitting: Dermatology

## 2021-12-03 ENCOUNTER — Ambulatory Visit (INDEPENDENT_AMBULATORY_CARE_PROVIDER_SITE_OTHER): Payer: BC Managed Care – PPO | Admitting: Dermatology

## 2021-12-03 DIAGNOSIS — L570 Actinic keratosis: Secondary | ICD-10-CM

## 2021-12-03 DIAGNOSIS — I872 Venous insufficiency (chronic) (peripheral): Secondary | ICD-10-CM | POA: Diagnosis not present

## 2021-12-03 DIAGNOSIS — L821 Other seborrheic keratosis: Secondary | ICD-10-CM

## 2021-12-03 DIAGNOSIS — L817 Pigmented purpuric dermatosis: Secondary | ICD-10-CM

## 2021-12-03 DIAGNOSIS — Z1283 Encounter for screening for malignant neoplasm of skin: Secondary | ICD-10-CM

## 2021-12-03 DIAGNOSIS — L304 Erythema intertrigo: Secondary | ICD-10-CM

## 2021-12-03 DIAGNOSIS — L814 Other melanin hyperpigmentation: Secondary | ICD-10-CM

## 2021-12-03 DIAGNOSIS — L578 Other skin changes due to chronic exposure to nonionizing radiation: Secondary | ICD-10-CM | POA: Diagnosis not present

## 2021-12-03 DIAGNOSIS — Z86018 Personal history of other benign neoplasm: Secondary | ICD-10-CM

## 2021-12-03 DIAGNOSIS — L820 Inflamed seborrheic keratosis: Secondary | ICD-10-CM | POA: Diagnosis not present

## 2021-12-03 DIAGNOSIS — D229 Melanocytic nevi, unspecified: Secondary | ICD-10-CM

## 2021-12-03 DIAGNOSIS — D1800 Hemangioma unspecified site: Secondary | ICD-10-CM

## 2021-12-03 DIAGNOSIS — Z85828 Personal history of other malignant neoplasm of skin: Secondary | ICD-10-CM

## 2021-12-03 MED ORDER — KETOCONAZOLE 2 % EX CREA: 1.0000 "application " | TOPICAL_CREAM | Freq: Every day | CUTANEOUS | 5 refills | Status: AC | PRN

## 2021-12-03 MED ORDER — TRIAMCINOLONE ACETONIDE 0.1 % EX CREA: TOPICAL_CREAM | CUTANEOUS | 2 refills | Status: DC

## 2021-12-03 NOTE — Patient Instructions (Signed)
Cryotherapy Aftercare ? ?Wash gently with soap and water everyday.   ?Apply Vaseline and Band-Aid daily until healed.  ? ? ?Melanoma ABCDEs ? ?Melanoma is the most dangerous type of skin cancer, and is the leading cause of death from skin disease.  You are more likely to develop melanoma if you: ?Have light-colored skin, light-colored eyes, or red or blond hair ?Spend a lot of time in the sun ?Tan regularly, either outdoors or in a tanning bed ?Have had blistering sunburns, especially during childhood ?Have a close family member who has had a melanoma ?Have atypical moles or large birthmarks ? ?Early detection of melanoma is key since treatment is typically straightforward and cure rates are extremely high if we catch it early.  ? ?The first sign of melanoma is often a change in a mole or a new dark spot.  The ABCDE system is a way of remembering the signs of melanoma. ? ?A for asymmetry:  The two halves do not match. ?B for border:  The edges of the growth are irregular. ?C for color:  A mixture of colors are present instead of an even brown color. ?D for diameter:  Melanomas are usually (but not always) greater than 71m - the size of a pencil eraser. ?E for evolution:  The spot keeps changing in size, shape, and color. ? ?Please check your skin once per month between visits. You can use a small mirror in front and a large mirror behind you to keep an eye on the back side or your body.  ? ?If you see any new or changing lesions before your next follow-up, please call to schedule a visit. ? ?Please continue daily skin protection including broad spectrum sunscreen SPF 30+ to sun-exposed areas, reapplying every 2 hours as needed when you're outdoors.   ? ?If You Need Anything After Your Visit ? ?If you have any questions or concerns for your doctor, please call our main line at [phone] press option 4 to reach your doctor's medical assistant. If no one answers, please leave a voicemail as directed and we will  return your call as soon as possible. Messages left after 4 pm will be answered the following business day.  ? ?You may also send [place] message via MyChart. We typically respond to MyChart messages within 1-2 business days. ? ?For prescription refills, please ask your pharmacy to contact our office. Our fax number is [phone] ? ?If you have an urgent issue when the clinic is closed that cannot wait until the next business day, you can page your doctor at the number below.   ? ?Please note that while we do our best to be available for urgent issues outside of office hours, we are not available 24/7.  ? ?If you have an urgent issue and are unable to reach [place] you may choose to seek medical care at your doctor's office, retail clinic, urgent care center, or emergency room. ? ?If you have a medical emergency, please immediately call 911 or go to the emergency department. ? ?Pager Numbers ? ?- Dr. KNehemiah Massed [phone]? ?- Dr. MLaurence Ferrari [phone]? ?- Dr. SNicole Kindred [phone]? ?In the event of inclement weather, please call our main line at [phone] an update on the status of any delays or closures. ? ?Dermatology Medication Tips: ?Please keep the boxes that topical medications come in in order to help keep track of the instructions about where and how to use these. Pharmacies typically print the medication  instructions only on the boxes and not directly on the medication tubes.  ? ?If your medication is too expensive, please contact our office at [phone] option 4 or send [place] a message through [place].  ? ?We are unable to tell what your co-pay for medications will be in advance as this is different depending on your insurance coverage. However, we may be able to find a substitute medication at lower cost or fill out paperwork to get insurance to cover a needed medication.  ? ?If a prior authorization is required to get your medication covered by your insurance company, please allow [place] 1-2 business  days to complete this process. ? ?Drug prices often vary depending on where the prescription is filled and some pharmacies may offer cheaper prices. ? ?The website www.goodrx.com contains coupons for medications through different pharmacies. The prices here do not account for what the cost may be with help from insurance (it may be cheaper with your insurance), but the website can give you the price if you did not use any insurance.  ?- You can print the associated coupon and take it with your prescription to the pharmacy.  ?- You may also stop by our office during regular business hours and pick up a GoodRx coupon card.  ?- If you need your prescription sent electronically to a different pharmacy, notify our office through [facility] or by phone at [phone] option 4. ? ? ? ? ?Si Usted Necesita Algo Despu?s de Su Visita ? ?Tambi?n puede enviarnos un mensaje a trav?s de MyChart. Por lo general respondemos a los mensajes de MyChart en el transcurso de 1 a 2 d?as h?biles. ? ?Para renovar recetas, por favor pida a su farmacia que se ponga en contacto con nuestra oficina. Nuestro n?mero de fax es el [phone]. ? ?Si tiene un asunto urgente cuando la cl?nica est? cerrada y que no puede esperar hasta el siguiente d?a h?bil, puede llamar/localizar a su doctor(a) al n?mero que aparece a continuaci?n.  ? ?Por favor, tenga en cuenta que aunque hacemos todo lo posible para estar disponibles para asuntos urgentes fuera del horario de oficina, no estamos disponibles las 24 horas del d?a, los 7 d?as de la semana.  ? ?Si tiene un problema urgente y no puede comunicarse con nosotros, puede optar por buscar atenci?n m?dica  en el consultorio de su doctor(a), en una cl?nica privada, en un centro de atenci?n urgente o en una sala de emergencias. ? ?Si tiene Engineer, maintenance (IT) m?dica, por favor llame inmediatamente al 911 o vaya a la sala de emergencias. ? ?N?meros de b?per ? ?- Dr. Nehemiah Massed: [phone] ? ?- Dra. Moye:  [phone] ? ?- Dra. Nicole Kindred: [phone] ? ?En caso de inclemencias del tiempo, por favor llame a nuestra l?nea principal al [phone] para una actualizaci?n sobre el estado de cualquier retraso o cierre. ? ?Consejos para la medicaci?n en dermatolog?a: ?Por favor, guarde las cajas en las que vienen los medicamentos de uso t?pico para ayudarle a seguir las instrucciones sobre d?nde y c?mo usarlos. Las farmacias generalmente imprimen las instrucciones del medicamento s?lo en las cajas y no directamente en los tubos del [place].  ? ?Si su medicamento es muy caro, por favor, p?ngase en contacto con Zigmund Daniel llamando al [phone] y presione la opci?n 4 o env?enos un mensaje a trav?s de MyChart.  ? ?No podemos decirle cu?l ser? su copago por los medicamentos por adelantado ya que esto es diferente dependiendo de la cobertura de su seguro. Sin embargo,  es posible que podamos encontrar un medicamento sustituto a Electrical engineer un formulario para que el seguro cubra el medicamento que se considera necesario.  ? ?Si se requiere Ardelia Mems autorizaci?n previa para que su compa??a de seguros [place] su medicamento, por favor perm?tanos de 1 a 2 d?as h?biles para completar este proceso. ? ?Los precios de los medicamentos var?an con frecuencia dependiendo del Environmental consultant de d?nde se surte la receta y alguna farmacias pueden ofrecer precios m?s baratos. ? ?El sitio web www.goodrx.com tiene cupones para medicamentos de Airline pilot. Los precios aqu? no tienen en cuenta lo que podr?a costar con la ayuda del seguro (puede ser m?s barato con su seguro), pero el sitio web puede darle el precio si no utiliz? ning?n seguro.  ?- Puede imprimir el cup?n correspondiente y llevarlo con su receta a la farmacia.  ?- Tambi?n puede pasar por nuestra oficina durante el horario de atenci?n regular y recoger una tarjeta de cupones de GoodRx.  ?- Si necesita que su receta se env?e electr?nicamente a Chiropodist,  informe a nuestra oficina a trav?s de MyChart de [place] o por tel?fono llamando al [phone] y presione la opci?n 4. ? ?

## 2021-12-03 NOTE — Progress Notes (Signed)
? ?Follow-Up Visit ?  ?Subjective  ?Rebecca Charles is a 71 y.o. female who presents for the following: Annual Exam (Patient here for full body skin exam and skin cancer screening. 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 Patient does have some new spots at right popliteal, left lip and hands. ). 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 Patient advises she does not use often and only uses until clear. ? ?The following portions of the chart were reviewed this encounter and updated as appropriate:  ? Tobacco  Allergies  Meds  Problems  Med Hx  Surg Hx  Fam Hx   ?  ?Review of Systems:  No other skin or systemic complaints except as noted in HPI or Assessment and Plan. ? ?Objective  ?Well appearing patient in no apparent distress; mood and affect are within normal limits. ? ?A full examination was performed including scalp, head, eyes, ears, nose, lips, neck, chest, axillae, abdomen, back, buttocks, bilateral upper extremities, bilateral lower extremities, hands, feet, fingers, toes, fingernails, and toenails. All findings within normal limits unless otherwise noted below. ? ?left upper lip x 1, hands x 8 (9) ?Erythematous thin papules/macules with gritty scale.  ? ?inframammary, axillary area ? ? ?right medial knee x 1 ?Erythematous stuck-on, waxy papule or plaque ? ? ?Assessment & Plan  ?AK (actinic keratosis) (9) ?left upper lip x 1, hands x 8 ?Patient to RTC if spot at lip not clear in 6 weeks.  ? ?Destruction of lesion - left upper lip x 1, hands x 8 ?Complexity: simple   ?Destruction method: cryotherapy   ?Informed consent: discussed and consent obtained   ?Timeout:  patient name, date of birth, surgical site, and procedure verified ?Lesion destroyed using liquid nitrogen: Yes   ?Region frozen until ice ball extended beyond lesion: Yes   ?Outcome: patient tolerated procedure well with no complications   ?Post-procedure details: wound care  instructions given   ? ?Schamberg's purpura with stasis dermatitis ?lower legs ?Stasis in the legs causes chronic leg swelling, which may result in itchy or painful rashes, skin discoloration, skin texture changes, and sometimes ulceration.  Recommend daily graduated compression hose/stockings- easiest to put on first thing in morning, remove at bedtime.  Elevate legs as much as possible. Avoid salt/sodium rich foods. ? ?Continue TMC 0.1% cream 1-2 times daily as needed for rash at lower legs. Avoid applying to face, groin, and axilla. Use as directed. Long-term use can cause thinning of the skin. ? ?Topical steroids (such as triamcinolone, fluocinolone, fluocinonide, mometasone, clobetasol, halobetasol, betamethasone, hydrocortisone) can cause thinning and lightening of the skin if they are used for too long in the same area. Your physician has selected the right strength medicine for your problem and area affected on the body. Please use your medication only as directed by your physician to prevent side effects.  ? ?triamcinolone cream (KENALOG) 0.1 % - lower legs ?Apply 1-2 times daily as needed for rash at lower legs. Avoid applying to face, groin, and axilla. Use as directed. Long-term use can cause thinning of the skin. ? ?Erythema intertrigo ?inframammary, axillary area ?Intertrigo is a chronic recurrent rash that occurs in skin fold areas that may be associated with friction; heat; moisture; yeast; fungus; and bacteria.  It is exacerbated by increased movement / activity; sweating; and higher atmospheric temperature. ? ?Start ketoconazole 2% cream daily until clear then as needed ? ?ketoconazole (NIZORAL) 2 %  cream - inframammary, axillary area ?Apply 1 application. topically daily as needed for irritation. ? ?Inflamed seborrheic keratosis ?right medial knee x 1 ?Destruction of lesion - right medial knee x 1 ?Complexity: simple   ?Destruction method: cryotherapy   ?Informed consent: discussed and consent  obtained   ?Timeout:  patient name, date of birth, surgical site, and procedure verified ?Lesion destroyed using liquid nitrogen: Yes   ?Region frozen until ice ball extended beyond lesion: Yes   ?Outcome: patient tolerated procedure well with no complications   ?Post-procedure details: wound care instructions given   ? ?History of Basal Cell Carcinoma of the Skin ?- No evidence of recurrence today ?- Recommend regular full body skin exams ?- Recommend daily broad spectrum sunscreen SPF 30+ to sun-exposed areas, reapply every 2 hours as needed.  ?- Call if any new or changing lesions are noted between office visits ? ?History of Dysplastic Nevi ?- No evidence of recurrence today ?- Recommend regular full body skin exams ?- Recommend daily broad spectrum sunscreen SPF 30+ to sun-exposed areas, reapply every 2 hours as needed.  ?- Call if any new or changing lesions are noted between office visits ? ?History of Squamous Cell Carcinoma of the Skin ?- No evidence of recurrence today ?- No lymphadenopathy ?- Recommend regular full body skin exams ?- Recommend daily broad spectrum sunscreen SPF 30+ to sun-exposed areas, reapply every 2 hours as needed.  ?- Call if any new or changing lesions are noted between office visits ? ?Lentigines ?- Scattered tan macules ?- Due to sun exposure ?- Benign-appearing, observe ?- Recommend daily broad spectrum sunscreen SPF 30+ to sun-exposed areas, reapply every 2 hours as needed. ?- Call for any changes ? ?Seborrheic Keratoses ?- Stuck-on, waxy, tan-brown papules and/or plaques  ?- Benign-appearing ?- Discussed benign etiology and prognosis. ?- Observe ?- Call for any changes ? ?Melanocytic Nevi ?- Tan-brown and/or pink-flesh-colored symmetric macules and papules ?- Benign appearing on exam today ?- Observation ?- Call clinic for new or changing moles ?- Recommend daily use of broad spectrum spf 30+ sunscreen to sun-exposed areas.  ? ?Hemangiomas ?- Red papules ?- Discussed benign  nature ?- Observe ?- Call for any changes ? ?Actinic Damage ?- Chronic condition, secondary to cumulative UV/sun exposure ?- diffuse scaly erythematous macules with underlying dyspigmentation ?- Recommend daily broad spectrum sunscreen SPF 30+ to sun-exposed areas, reapply every 2 hours as needed.  ?- Staying in the shade or wearing long sleeves, sun glasses (UVA+UVB protection) and wide brim hats (4-inch brim around the entire circumference of the hat) are also recommended for sun protection.  ?- Call for new or changing lesions. ? ?Skin cancer screening performed today. ? ?Return in about 1 year (around 12/04/2022) for TBSE, 6 month AK follow up. ? ?Graciella Belton, RMA, am acting as scribe for Sarina Ser, MD . ?Documentation: I have reviewed the above documentation for accuracy and completeness, and I agree with the above. ? ?Sarina Ser, MD ? ?

## 2021-12-09 ENCOUNTER — Encounter: Payer: Self-pay | Admitting: Dermatology

## 2022-06-16 ENCOUNTER — Ambulatory Visit (INDEPENDENT_AMBULATORY_CARE_PROVIDER_SITE_OTHER): Payer: BC Managed Care – PPO | Admitting: Dermatology

## 2022-06-16 DIAGNOSIS — Z8589 Personal history of malignant neoplasm of other organs and systems: Secondary | ICD-10-CM

## 2022-06-16 DIAGNOSIS — L821 Other seborrheic keratosis: Secondary | ICD-10-CM

## 2022-06-16 DIAGNOSIS — L578 Other skin changes due to chronic exposure to nonionizing radiation: Secondary | ICD-10-CM | POA: Diagnosis not present

## 2022-06-16 DIAGNOSIS — Z86007 Personal history of in-situ neoplasm of skin: Secondary | ICD-10-CM | POA: Diagnosis not present

## 2022-06-16 DIAGNOSIS — L570 Actinic keratosis: Secondary | ICD-10-CM

## 2022-06-16 NOTE — Patient Instructions (Addendum)
Due to recent changes in healthcare laws, you may see results of your pathology and/or laboratory studies on MyChart before the doctors have had a chance to review them. We understand that in some cases there may be results that are confusing or concerning to you. Please understand that not all results are received at the same time and often the doctors may need to interpret multiple results in order to provide you with the best plan of care or course of treatment. Therefore, we ask that you please give us 2 business days to thoroughly review all your results before contacting the office for clarification. Should we see a critical lab result, you will be contacted sooner.   If You Need Anything After Your Visit  If you have any questions or concerns for your doctor, please call our main line at [phone] and press option 4 to reach your doctor's medical assistant. If no one answers, please leave a voicemail as directed and we will return your call as soon as possible. Messages left after 4 pm will be answered the following business day.   You may also send us a message via MyChart. We typically respond to MyChart messages within 1-2 business days.  For prescription refills, please ask your pharmacy to contact our office. Our fax number is [phone].  If you have an urgent issue when the clinic is closed that cannot wait until the next business day, you can page your doctor at the number below.    Please note that while we do our best to be available for urgent issues outside of office hours, we are not available 24/7.   If you have an urgent issue and are unable to reach us, you may choose to seek medical care at your doctor's office, retail clinic, urgent care center, or emergency room.  If you have a medical emergency, please immediately call 911 or go to the emergency department.  Pager Numbers  - Dr. Kowalski: [phone]  - Dr. Moye: [phone]  - Dr. Stewart:  [phone]  In the event of inclement weather, please call our main line at [phone] for an update on the status of any delays or closures.  Dermatology Medication Tips: Please keep the boxes that topical medications come in in order to help keep track of the instructions about where and how to use these. Pharmacies typically print the medication instructions only on the boxes and not directly on the medication tubes.   If your medication is too expensive, please contact our office at [phone] option 4 or send us a message through MyChart.   We are unable to tell what your co-pay for medications will be in advance as this is different depending on your insurance coverage. However, we may be able to find a substitute medication at lower cost or fill out paperwork to get insurance to cover a needed medication.   If a prior authorization is required to get your medication covered by your insurance company, please allow us 1-2 business days to complete this process.  Drug prices often vary depending on where the prescription is filled and some pharmacies may offer cheaper prices.  The website www.goodrx.com contains coupons for medications through different pharmacies. The prices here do not account for what the cost may be with help from insurance (it may be cheaper with your insurance), but the website can give you the price if you did not use any insurance.  - You can print the associated coupon and take it with   your prescription to the pharmacy.  - You may also stop by our office during regular business hours and pick up a GoodRx coupon card.  - If you need your prescription sent electronically to a different pharmacy, notify our office through [place] MyChart or by phone at [phone] option 4.     Si Usted Necesita Algo Despus de Su Visita  Tambin puede enviarnos un mensaje a travs de MyChart. Por lo general respondemos a los mensajes de MyChart en el transcurso de 1 a 2  das hbiles.  Para renovar recetas, por favor pida a su farmacia que se ponga en contacto con nuestra oficina. Nuestro nmero de fax es el [phone].  Si tiene un asunto urgente cuando la clnica est cerrada y que no puede esperar hasta el siguiente da hbil, puede llamar/localizar a su doctor(a) al nmero que aparece a continuacin.   Por favor, tenga en cuenta que aunque hacemos todo lo posible para estar disponibles para asuntos urgentes fuera del horario de oficina, no estamos disponibles las 24 horas del da, los 7 das de la semana.   Si tiene un problema urgente y no puede comunicarse con nosotros, puede optar por buscar atencin mdica  en el consultorio de su doctor(a), en una clnica privada, en un centro de atencin urgente o en una sala de emergencias.  Si tiene una emergencia mdica, por favor llame inmediatamente al 911 o vaya a la sala de emergencias.  Nmeros de bper  - Dr. Kowalski: [phone]  - Dra. Moye: [phone]  - Dra. Stewart: [phone]  En caso de inclemencias del tiempo, por favor llame a nuestra lnea principal al [phone] para una actualizacin sobre el estado de cualquier retraso o cierre.  Consejos para la medicacin en dermatologa: Por favor, guarde las cajas en las que vienen los medicamentos de uso tpico para ayudarle a seguir las instrucciones sobre dnde y cmo usarlos. Las farmacias generalmente imprimen las instrucciones del medicamento slo en las cajas y no directamente en los tubos del medicamento.   Si su medicamento es muy caro, por favor, pngase en contacto con nuestra oficina llamando al [phone] y presione la opcin 4 o envenos un mensaje a travs de MyChart.   No podemos decirle cul ser su copago por los medicamentos por adelantado ya que esto es diferente dependiendo de la cobertura de su seguro. Sin embargo, es posible que podamos encontrar un medicamento sustituto a menor costo o llenar un formulario para que el  seguro cubra el medicamento que se considera necesario.   Si se requiere una autorizacin previa para que su compaa de seguros cubra su medicamento, por favor permtanos de 1 a 2 das hbiles para completar este proceso.  Los precios de los medicamentos varan con frecuencia dependiendo del lugar de dnde se surte la receta y alguna farmacias pueden ofrecer precios ms baratos.  El sitio web www.goodrx.com tiene cupones para medicamentos de diferentes farmacias. Los precios aqu no tienen en cuenta lo que podra costar con la ayuda del seguro (puede ser ms barato con su seguro), pero el sitio web puede darle el precio si no utiliz ningn seguro.  - Puede imprimir el cupn correspondiente y llevarlo con su receta a la farmacia.  - Tambin puede pasar por nuestra oficina durante el horario de atencin regular y recoger una tarjeta de cupones de GoodRx.  - Si necesita que su receta se enve electrnicamente a una farmacia diferente, informe a nuestra oficina a travs de MyChart de    o por telfono llamando al [phone] y presione la opcin 4.  

## 2022-06-16 NOTE — Progress Notes (Signed)
   Follow-Up Visit   Subjective  Rebecca Charles is a 71 y.o. female who presents for the following: Actinic Keratosis (6 month aks. Hx at hands and left upper lip. Hx of sccis at right hand. ). The patient has spots, moles and lesions to be evaluated, some may be new or changing and the patient has concerns that these could be cancer.  The following portions of the chart were reviewed this encounter and updated as appropriate:  Tobacco  Allergies  Meds  Problems  Med Hx  Surg Hx  Fam Hx     Review of Systems: No other skin or systemic complaints except as noted in HPI or Assessment and Plan.  Objective  Well appearing patient in no apparent distress; mood and affect are within normal limits.  A focused examination was performed including face, b/l arms and hands. Relevant physical exam findings are noted in the Assessment and Plan.  right dorsum hand Erythematous thin papules/macules with gritty scale.    Assessment & Plan  Actinic keratosis right dorsum hand Actinic keratoses are precancerous spots that appear secondary to cumulative UV radiation exposure/sun exposure over time. They are chronic with expected duration over 1 year. A portion of actinic keratoses will progress to squamous cell carcinoma of the skin. It is not possible to reliably predict which spots will progress to skin cancer and so treatment is recommended to prevent development of skin cancer.  Recommend daily broad spectrum sunscreen SPF 30+ to sun-exposed areas, reapply every 2 hours as needed.  Recommend staying in the shade or wearing long sleeves, sun glasses (UVA+UVB protection) and wide brim hats (4-inch brim around the entire circumference of the hat). Call for new or changing lesions.  Destruction of lesion - right dorsum hand Complexity: simple   Informed consent: discussed and consent obtained   Timeout:  patient name, date of birth, surgical site, and procedure verified Outcome: patient tolerated  procedure well with no complications   Post-procedure details: wound care instructions given   Additional details:  Prior to procedure, discussed risks of blister formation, small wound, skin dyspigmentation, or rare scar following cryotherapy. Recommend Vaseline ointment to treated areas while healing.  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  - Call if any new or changing lesions are noted between office visits  Seborrheic Keratoses - Stuck-on, waxy, tan-brown papules and/or plaques  - Benign-appearing - Discussed benign etiology and prognosis. - Observe - Call for any changes  Actinic Damage - chronic, secondary to cumulative UV radiation exposure/sun exposure over time - diffuse scaly erythematous macules with underlying dyspigmentation - Recommend daily broad spectrum sunscreen SPF 30+ to sun-exposed areas, reapply every 2 hours as needed.  - Recommend staying in the shade or wearing long sleeves, sun glasses (UVA+UVB protection) and wide brim hats (4-inch brim around the entire circumference of the hat). - Call for new or changing lesions.  Return for keep follow up as schedule 12/11/2022. IRuthell Rummage, CMA, am acting as scribe for Sarina Ser, MD. Documentation: I have reviewed the above documentation for accuracy and completeness, and I agree with the above.  Sarina Ser, MD

## 2022-06-22 ENCOUNTER — Encounter: Payer: Self-pay | Admitting: Dermatology

## 2022-11-17 ENCOUNTER — Other Ambulatory Visit: Payer: Self-pay | Admitting: Pulmonary Disease

## 2022-11-17 DIAGNOSIS — Z1231 Encounter for screening mammogram for malignant neoplasm of breast: Secondary | ICD-10-CM

## 2022-12-01 ENCOUNTER — Ambulatory Visit (INDEPENDENT_AMBULATORY_CARE_PROVIDER_SITE_OTHER): Payer: BC Managed Care – PPO | Admitting: Dermatology

## 2022-12-01 VITALS — BP 126/64 | HR 71

## 2022-12-01 DIAGNOSIS — Z86018 Personal history of other benign neoplasm: Secondary | ICD-10-CM | POA: Diagnosis not present

## 2022-12-01 DIAGNOSIS — Z1283 Encounter for screening for malignant neoplasm of skin: Secondary | ICD-10-CM | POA: Diagnosis not present

## 2022-12-01 DIAGNOSIS — D229 Melanocytic nevi, unspecified: Secondary | ICD-10-CM

## 2022-12-01 DIAGNOSIS — Z85828 Personal history of other malignant neoplasm of skin: Secondary | ICD-10-CM

## 2022-12-01 DIAGNOSIS — L821 Other seborrheic keratosis: Secondary | ICD-10-CM

## 2022-12-01 DIAGNOSIS — L820 Inflamed seborrheic keratosis: Secondary | ICD-10-CM

## 2022-12-01 DIAGNOSIS — I8310 Varicose veins of unspecified lower extremity with inflammation: Secondary | ICD-10-CM

## 2022-12-01 DIAGNOSIS — L578 Other skin changes due to chronic exposure to nonionizing radiation: Secondary | ICD-10-CM | POA: Diagnosis not present

## 2022-12-01 DIAGNOSIS — L570 Actinic keratosis: Secondary | ICD-10-CM

## 2022-12-01 DIAGNOSIS — D1801 Hemangioma of skin and subcutaneous tissue: Secondary | ICD-10-CM

## 2022-12-01 DIAGNOSIS — Z8589 Personal history of malignant neoplasm of other organs and systems: Secondary | ICD-10-CM

## 2022-12-01 NOTE — Progress Notes (Signed)
Follow-Up Visit   Subjective  Rebecca Charles is a 72 y.o. female who presents for the following: Skin Cancer Screening and Full Body Skin Exam Look at back under bra line scaly places, right hand , face  The patient presents for Total-Body Skin Exam (TBSE) for skin cancer screening and mole check. The patient has spots, moles and lesions to be evaluated, some may be new or changing and the patient has concerns that these could be cancer.  The following portions of the chart were reviewed this encounter and updated as appropriate: medications, allergies, medical history  Review of Systems:  No other skin or systemic complaints except as noted in HPI or Assessment and Plan.  Objective  Well appearing patient in no apparent distress; mood and affect are within normal limits.  A full examination was performed including scalp, head, eyes, ears, nose, lips, neck, chest, axillae, abdomen, back, buttocks, bilateral upper extremities, bilateral lower extremities, hands, feet, fingers, toes, fingernails, and toenails. All findings within normal limits unless otherwise noted below.   Relevant physical exam findings are noted in the Assessment and Plan.   Assessment & Plan   ACTINIC KERATOSIS Exam: Erythematous thin papules/macules with gritty scale  Actinic keratoses are precancerous spots that appear secondary to cumulative UV radiation exposure/sun exposure over time. They are chronic with expected duration over 1 year. A portion of actinic keratoses will progress to squamous cell carcinoma of the skin. It is not possible to reliably predict which spots will progress to skin cancer and so treatment is recommended to prevent development of skin cancer.  Recommend daily broad spectrum sunscreen SPF 30+ to sun-exposed areas, reapply every 2 hours as needed.  Recommend staying in the shade or wearing long sleeves, sun glasses (UVA+UVB protection) and wide brim hats (4-inch brim around the entire  circumference of the hat). Call for new or changing lesions.  Treatment Plan:  Prior to procedure, discussed risks of blister formation, small wound, skin dyspigmentation, or rare scar following cryotherapy. Recommend Vaseline ointment to treated areas while healing.  Destruction Procedure Note Destruction method: cryotherapy   Informed consent: discussed and consent obtained   Lesion destroyed using liquid nitrogen: Yes   Outcome: patient tolerated procedure well with no complications   Post-procedure details: wound care instructions given   Locations: hands x  2 # of Lesions Treated: 2  INFLAMED SEBORRHEIC KERATOSIS Exam: Erythematous keratotic or waxy stuck-on papule or plaque.  Symptomatic, irritating, patient would like treated.  Benign-appearing.  Call clinic for new or changing lesions.   Prior to procedure, discussed risks of blister formation, small wound, skin dyspigmentation, or rare scar following treatment. Recommend Vaseline ointment to treated areas while healing.  Destruction Procedure Note Destruction method: cryotherapy   Informed consent: discussed and consent obtained   Lesion destroyed using liquid nitrogen: Yes   Outcome: patient tolerated procedure well with no complications   Post-procedure details: wound care instructions given   Locations: hands x 5 , left lower leg x 1, trunk x 6 # of Lesions Treated: 12  LENTIGINES, SEBORRHEIC KERATOSES, HEMANGIOMAS - Benign normal skin lesions - Benign-appearing - Call for any changes  MELANOCYTIC NEVI - Tan-brown and/or pink-flesh-colored symmetric macules and papules - Benign appearing on exam today - Observation - Call clinic for new or changing moles - Recommend daily use of broad spectrum spf 30+ sunscreen to sun-exposed areas.   Varicose Veins/Spider Veins - Dilated blue, purple or red veins at the lower extremities - Reassured -  Smaller vessels can be treated by sclerotherapy (a procedure to inject a  medicine into the veins to make them disappear) if desired, but the treatment is not covered by insurance. Larger vessels may be covered if symptomatic and we would refer to vascular surgeon if treatment desired.  ACTINIC DAMAGE - Chronic condition, secondary to cumulative UV/sun exposure - diffuse scaly erythematous macules with underlying dyspigmentation - Recommend daily broad spectrum sunscreen SPF 30+ to sun-exposed areas, reapply every 2 hours as needed.  - Staying in the shade or wearing long sleeves, sun glasses (UVA+UVB protection) and wide brim hats (4-inch brim around the entire circumference of the hat) are also recommended for sun protection.  - Call for new or changing lesions.  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  - Call if any new or changing lesions are noted between office visits  HISTORY OF DYSPLASTIC NEVUS Right epigastric moderate 2011 No evidence of recurrence today Recommend regular full body skin exams Recommend daily broad spectrum sunscreen SPF 30+ to sun-exposed areas, reapply every 2 hours as needed.  Call if any new or changing lesions are noted between office visits  SKIN CANCER SCREENING PERFORMED TODAY.   Return in about 1 year (around 12/01/2023) for TBSE.  IAsher Muir, Melissa Wilson, CMA, am acting as scribe for Armida Sansavid Dashia Caldeira, MD.  Documentation: I have reviewed the above documentation for  accuracy and completeness, and I agree with the above.  Armida Sansavid Kevyn Wengert, MD

## 2022-12-01 NOTE — Patient Instructions (Addendum)
Cryotherapy Aftercare  Wash gently with soap and water everyday.   Apply Vaseline and Band-Aid daily until healed.   Seborrheic Keratosis  What causes seborrheic keratoses? Seborrheic keratoses are harmless, common skin growths that first appear during adult life.  As time goes by, more growths appear.  Some people may develop a large number of them.  Seborrheic keratoses appear on both covered and uncovered body parts.  They are not caused by sunlight.  The tendency to develop seborrheic keratoses can be inherited.  They vary in color from skin-colored to gray, brown, or even black.  They can be either smooth or have a rough, warty surface.   Seborrheic keratoses are superficial and look as if they were stuck on the skin.  Under the microscope this type of keratosis looks like layers upon layers of skin.  That is why at times the top layer may seem to fall off, but the rest of the growth remains and re-grows.    Treatment Seborrheic keratoses do not need to be treated, but can easily be removed in the office.  Seborrheic keratoses often cause symptoms when they rub on clothing or jewelry.  Lesions can be in the way of shaving.  If they become inflamed, they can cause itching, soreness, or burning.  Removal of a seborrheic keratosis can be accomplished by freezing, burning, or surgery. If any spot bleeds, scabs, or grows rapidly, please return to have it checked, as these can be an indication of a skin cancer.   Actinic keratoses are precancerous spots that appear secondary to cumulative UV radiation exposure/sun exposure over time. They are chronic with expected duration over 1 year. A portion of actinic keratoses will progress to squamous cell carcinoma of the skin. It is not possible to reliably predict which spots will progress to skin cancer and so treatment is recommended to prevent development of skin cancer.  Recommend daily broad spectrum sunscreen SPF 30+ to sun-exposed areas, reapply  every 2 hours as needed.  Recommend staying in the shade or wearing long sleeves, sun glasses (UVA+UVB protection) and wide brim hats (4-inch brim around the entire circumference of the hat). Call for new or changing lesions.    Melanoma ABCDEs  Melanoma is the most dangerous type of skin cancer, and is the leading cause of death from skin disease.  You are more likely to develop melanoma if you: Have light-colored skin, light-colored eyes, or red or blond hair Spend a lot of time in the sun Tan regularly, either outdoors or in a tanning bed Have had blistering sunburns, especially during childhood Have a close family member who has had a melanoma Have atypical moles or large birthmarks  Early detection of melanoma is key since treatment is typically straightforward and cure rates are extremely high if we catch it early.   The first sign of melanoma is often a change in a mole or a new dark spot.  The ABCDE system is a way of remembering the signs of melanoma.  A for asymmetry:  The two halves do not match. B for border:  The edges of the growth are irregular. C for color:  A mixture of colors are present instead of an even brown color. D for diameter:  Melanomas are usually (but not always) greater than 6mm - the size of a pencil eraser. E for evolution:  The spot keeps changing in size, shape, and color.  Please check your skin once per month between visits. You can use a   small mirror in front and a large mirror behind you to keep an eye on the back side or your body.   If you see any new or changing lesions before your next follow-up, please call to schedule a visit.  Please continue daily skin protection including broad spectrum sunscreen SPF 30+ to sun-exposed areas, reapplying every 2 hours as needed when you're outdoors.   Staying in the shade or wearing long sleeves, sun glasses (UVA+UVB protection) and wide brim hats (4-inch brim around the entire circumference of the hat) are  also recommended for sun protection.    Due to recent changes in healthcare laws, you may see results of your pathology and/or laboratory studies on MyChart before the doctors have had a chance to review them. We understand that in some cases there may be results that are confusing or concerning to you. Please understand that not all results are received at the same time and often the doctors may need to interpret multiple results in order to provide you with the best plan of care or course of treatment. Therefore, we ask that you please give us 2 business days to thoroughly review all your results before contacting the office for clarification. Should we see a critical lab result, you will be contacted sooner.   If You Need Anything After Your Visit  If you have any questions or concerns for your doctor, please call our main line at [phone] and press option 4 to reach your doctor's medical assistant. If no one answers, please leave a voicemail as directed and we will return your call as soon as possible. Messages left after 4 pm will be answered the following business day.   You may also send us a message via MyChart. We typically respond to MyChart messages within 1-2 business days.  For prescription refills, please ask your pharmacy to contact our office. Our fax number is [phone].  If you have an urgent issue when the clinic is closed that cannot wait until the next business day, you can page your doctor at the number below.    Please note that while we do our best to be available for urgent issues outside of office hours, we are not available 24/7.   If you have an urgent issue and are unable to reach us, you may choose to seek medical care at your doctor's office, retail clinic, urgent care center, or emergency room.  If you have a medical emergency, please immediately call 911 or go to the emergency department.  Pager Numbers  - Dr. Kowalski: [phone]  - Dr. Moye:  [phone]  - Dr. Stewart: [phone]  In the event of inclement weather, please call our main line at [phone] for an update on the status of any delays or closures.  Dermatology Medication Tips: Please keep the boxes that topical medications come in in order to help keep track of the instructions about where and how to use these. Pharmacies typically print the medication instructions only on the boxes and not directly on the medication tubes.   If your medication is too expensive, please contact our office at [phone] option 4 or send us a message through MyChart.   We are unable to tell what your co-pay for medications will be in advance as this is different depending on your insurance coverage. However, we may be able to find a substitute medication at lower cost or fill out paperwork to get insurance to cover a needed medication.   If a prior   authorization is required to get your medication covered by your insurance company, please allow us 1-2 business days to complete this process.  Drug prices often vary depending on where the prescription is filled and some pharmacies may offer cheaper prices.  The website www.goodrx.com contains coupons for medications through different pharmacies. The prices here do not account for what the cost may be with help from insurance (it may be cheaper with your insurance), but the website can give you the price if you did not use any insurance.  - You can print the associated coupon and take it with your prescription to the pharmacy.  - You may also stop by our office during regular business hours and pick up a GoodRx coupon card.  - If you need your prescription sent electronically to a different pharmacy, notify our office through [facility] MyChart or by phone at [phone] option 4.     Si Usted Necesita Algo Despus de Su Visita  Tambin puede enviarnos un mensaje a travs de MyChart. Por lo general respondemos a los mensajes de  MyChart en el transcurso de 1 a 2 das hbiles.  Para renovar recetas, por favor pida a su farmacia que se ponga en contacto con nuestra oficina. Nuestro nmero de fax es el [phone].  Si tiene un asunto urgente cuando la clnica est cerrada y que no puede esperar hasta el siguiente da hbil, puede llamar/localizar a su doctor(a) al nmero que aparece a continuacin.   Por favor, tenga en cuenta que aunque hacemos todo lo posible para estar disponibles para asuntos urgentes fuera del horario de oficina, no estamos disponibles las 24 horas del da, los 7 das de la semana.   Si tiene un problema urgente y no puede comunicarse con nosotros, puede optar por buscar atencin mdica  en el consultorio de su doctor(a), en una clnica privada, en un centro de atencin urgente o en una sala de emergencias.  Si tiene una emergencia mdica, por favor llame inmediatamente al 911 o vaya a la sala de emergencias.  Nmeros de bper  - Dr. Kowalski: [phone]  - Dra. Moye: [phone]  - Dra. Stewart: [phone]  En caso de inclemencias del tiempo, por favor llame a nuestra lnea principal al [phone] para una actualizacin sobre el estado de cualquier retraso o cierre.  Consejos para la medicacin en dermatologa: Por favor, guarde las cajas en las que vienen los medicamentos de uso tpico para ayudarle a seguir las instrucciones sobre dnde y cmo usarlos. Las farmacias generalmente imprimen las instrucciones del medicamento slo en las cajas y no directamente en los tubos del medicamento.   Si su medicamento es muy caro, por favor, pngase en contacto con nuestra oficina llamando al [phone] y presione la opcin 4 o envenos un mensaje a travs de MyChart.   No podemos decirle cul ser su copago por los medicamentos por adelantado ya que esto es diferente dependiendo de la cobertura de su seguro. Sin embargo, es posible que podamos encontrar un medicamento sustituto a menor costo o  llenar un formulario para que el seguro cubra el medicamento que se considera necesario.   Si se requiere una autorizacin previa para que su compaa de seguros cubra su medicamento, por favor permtanos de 1 a 2 das hbiles para completar este proceso.  Los precios de los medicamentos varan con frecuencia dependiendo del lugar de dnde se surte la receta y alguna farmacias pueden ofrecer precios ms baratos.  El sitio web www.goodrx.com tiene cupones para   medicamentos de diferentes farmacias. Los precios aqu no tienen en cuenta lo que podra costar con la ayuda del seguro (puede ser ms barato con su seguro), pero el sitio web puede darle el precio si no utiliz ningn seguro.  - Puede imprimir el cupn correspondiente y llevarlo con su receta a la farmacia.  - Tambin puede pasar por nuestra oficina durante el horario de atencin regular y recoger una tarjeta de cupones de GoodRx.  - Si necesita que su receta se enve electrnicamente a una farmacia diferente, informe a nuestra oficina a travs de MyChart de [place] o por telfono llamando al [phone] y presione la opcin 4.   

## 2022-12-10 ENCOUNTER — Encounter: Payer: Self-pay | Admitting: Dermatology

## 2022-12-11 ENCOUNTER — Encounter: Payer: BC Managed Care – PPO | Admitting: Dermatology

## 2022-12-30 ENCOUNTER — Other Ambulatory Visit: Payer: Self-pay | Admitting: Internal Medicine

## 2022-12-30 DIAGNOSIS — M810 Age-related osteoporosis without current pathological fracture: Secondary | ICD-10-CM

## 2022-12-31 ENCOUNTER — Ambulatory Visit
Admission: RE | Admit: 2022-12-31 | Discharge: 2022-12-31 | Disposition: A | Discharge status: Home / Self Care | Payer: BC Managed Care – PPO | Source: Ambulatory Visit | Attending: Pulmonary Disease | Admitting: Pulmonary Disease

## 2022-12-31 DIAGNOSIS — Z1231 Encounter for screening mammogram for malignant neoplasm of breast: Secondary | ICD-10-CM

## 2023-07-09 ENCOUNTER — Other Ambulatory Visit: Payer: Self-pay | Admitting: Dermatology

## 2023-07-09 DIAGNOSIS — L817 Pigmented purpuric dermatosis: Secondary | ICD-10-CM

## 2023-12-09 ENCOUNTER — Other Ambulatory Visit: Payer: Self-pay | Admitting: Internal Medicine

## 2023-12-09 DIAGNOSIS — Z1231 Encounter for screening mammogram for malignant neoplasm of breast: Secondary | ICD-10-CM

## 2023-12-09 DIAGNOSIS — M810 Age-related osteoporosis without current pathological fracture: Secondary | ICD-10-CM

## 2024-01-01 ENCOUNTER — Ambulatory Visit
Admission: RE | Admit: 2024-01-01 | Discharge: 2024-01-01 | Disposition: A | Discharge status: Home / Self Care | Source: Ambulatory Visit | Attending: Internal Medicine | Admitting: Internal Medicine

## 2024-01-01 DIAGNOSIS — Z1231 Encounter for screening mammogram for malignant neoplasm of breast: Secondary | ICD-10-CM

## 2024-01-06 ENCOUNTER — Ambulatory Visit (INDEPENDENT_AMBULATORY_CARE_PROVIDER_SITE_OTHER): Payer: BC Managed Care – PPO | Admitting: Dermatology

## 2024-01-06 ENCOUNTER — Encounter: Payer: Self-pay | Admitting: Dermatology

## 2024-01-06 DIAGNOSIS — Z85828 Personal history of other malignant neoplasm of skin: Secondary | ICD-10-CM

## 2024-01-06 DIAGNOSIS — W908XXA Exposure to other nonionizing radiation, initial encounter: Secondary | ICD-10-CM

## 2024-01-06 DIAGNOSIS — Z1283 Encounter for screening for malignant neoplasm of skin: Secondary | ICD-10-CM | POA: Diagnosis not present

## 2024-01-06 DIAGNOSIS — L820 Inflamed seborrheic keratosis: Secondary | ICD-10-CM | POA: Diagnosis not present

## 2024-01-06 DIAGNOSIS — I8393 Asymptomatic varicose veins of bilateral lower extremities: Secondary | ICD-10-CM

## 2024-01-06 DIAGNOSIS — L814 Other melanin hyperpigmentation: Secondary | ICD-10-CM | POA: Diagnosis not present

## 2024-01-06 DIAGNOSIS — Z8589 Personal history of malignant neoplasm of other organs and systems: Secondary | ICD-10-CM

## 2024-01-06 DIAGNOSIS — Z86018 Personal history of other benign neoplasm: Secondary | ICD-10-CM

## 2024-01-06 DIAGNOSIS — L578 Other skin changes due to chronic exposure to nonionizing radiation: Secondary | ICD-10-CM | POA: Diagnosis not present

## 2024-01-06 DIAGNOSIS — D1801 Hemangioma of skin and subcutaneous tissue: Secondary | ICD-10-CM

## 2024-01-06 DIAGNOSIS — I781 Nevus, non-neoplastic: Secondary | ICD-10-CM

## 2024-01-06 DIAGNOSIS — L570 Actinic keratosis: Secondary | ICD-10-CM

## 2024-01-06 DIAGNOSIS — L821 Other seborrheic keratosis: Secondary | ICD-10-CM

## 2024-01-06 DIAGNOSIS — Z86007 Personal history of in-situ neoplasm of skin: Secondary | ICD-10-CM

## 2024-01-06 NOTE — Patient Instructions (Addendum)

## 2024-01-06 NOTE — Progress Notes (Signed)
 Follow-Up Visit   Subjective  Rebecca Charles is a 73 y.o. female who presents for the following: Skin Cancer Screening and Full Body Skin Exam Hx of aks, isks, hx of scc, hx of bcc, hx of dysplastic nevi  Spots on hands, and arms, and face, red places   The patient presents for Total-Body Skin Exam (TBSE) for skin cancer screening and mole check. The patient has spots, moles and lesions to be evaluated, some may be new or changing and the patient may have concern these could be cancer.  The following portions of the chart were reviewed this encounter and updated as appropriate: medications, allergies, medical history  Review of Systems:  No other skin or systemic complaints except as noted in HPI or Assessment and Plan.  Objective  Well appearing patient in no apparent distress; mood and affect are within normal limits.  A full examination was performed including scalp, head, eyes, ears, nose, lips, neck, chest, axillae, abdomen, back, buttocks, bilateral upper extremities, bilateral lower extremities, hands, feet, fingers, toes, fingernails, and toenails. All findings within normal limits unless otherwise noted below.   Relevant physical exam findings are noted in the Assessment and Plan.  Left Dorsal Hand x 2, right hand x 1 (3) Erythematous thin papules/macules with gritty scale.  right hand x 3, left upper back x 1, b/l pretibial x 15 (19) Erythematous stuck-on, waxy papule or plaque  Assessment & Plan   SKIN CANCER SCREENING PERFORMED TODAY.  ACTINIC DAMAGE - Chronic condition, secondary to cumulative UV/sun exposure - diffuse scaly erythematous macules with underlying dyspigmentation - Recommend daily broad spectrum sunscreen SPF 30+ to sun-exposed areas, reapply every 2 hours as needed.  - Staying in the shade or wearing long sleeves, sun glasses (UVA+UVB protection) and wide brim hats (4-inch brim around the entire circumference of the hat) are also recommended for sun  protection.  - Call for new or changing lesions.  LENTIGINES, SEBORRHEIC KERATOSES, HEMANGIOMAS - Benign normal skin lesions - Benign-appearing - Call for any changes  MELANOCYTIC NEVI - Tan-brown and/or pink-flesh-colored symmetric macules and papules - Benign appearing on exam today - Observation - Call clinic for new or changing moles - Recommend daily use of broad spectrum spf 30+ sunscreen to sun-exposed areas.   Varicose Veins/Spider Veins - Dilated blue, purple or red veins at the lower extremities - Reassured - Smaller vessels can be treated by sclerotherapy (a procedure to inject a medicine into the veins to make them disappear) if desired, but the treatment is not covered by insurance. Larger vessels may be covered if symptomatic and we would refer to vascular surgeon if treatment desired.  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  Call if any new or changing lesions are noted between office visits  ACTINIC KERATOSIS  (3) Left Dorsal Hand x 2, right hand x 1 (3) Actinic keratoses are precancerous spots that appear secondary to cumulative UV radiation exposure/sun exposure over time. They are chronic with expected duration over 1 year. A portion of actinic keratoses will progress to squamous cell carcinoma of the skin. It is not possible to reliably predict which spots will progress to skin cancer and so treatment is recommended to prevent development of skin cancer.  Recommend daily broad spectrum sunscreen SPF 30+ to sun-exposed areas, reapply every 2 hours as needed.  Recommend staying in the shade or wearing long sleeves, sun glasses (UVA+UVB protection) and wide brim hats (4-inch brim around the entire circumference of the hat). Call for new or changing lesions. Destruction of lesion - Left Dorsal Hand x 2, right hand x 1 (3) Complexity: simple   Destruction method: cryotherapy   Informed consent: discussed and consent obtained   Timeout:  patient name, date of birth, surgical site, and procedure verified Lesion destroyed using liquid nitrogen: Yes   Region frozen until ice ball extended beyond lesion: Yes   Outcome: patient tolerated procedure well with no complications   Post-procedure details: wound care instructions given   INFLAMED SEBORRHEIC KERATOSIS (19) right hand x 3, left upper back x 1, b/l pretibial x 15 (19) Symptomatic, irritating, patient would like treated. Destruction of lesion - right hand x 3, left upper back x 1, b/l pretibial x 15 (19) Complexity: simple   Destruction method: cryotherapy   Informed consent: discussed and consent obtained   Timeout:  patient name, date of birth, surgical site, and procedure verified Lesion destroyed using liquid nitrogen: Yes   Region frozen until ice ball extended beyond lesion: Yes   Outcome: patient tolerated procedure well with no complications   Post-procedure details: wound care instructions given   Return for 8 month tbse .  IRandee Busing, CMA, am acting as scribe for Celine Collard, MD.   Documentation: I have reviewed the above documentation for accuracy and completeness, and I agree with the above.  Celine Collard, MD

## 2024-08-19 ENCOUNTER — Other Ambulatory Visit

## 2024-09-07 ENCOUNTER — Ambulatory Visit: Admitting: Dermatology

## 2024-09-13 ENCOUNTER — Other Ambulatory Visit (HOSPITAL_BASED_OUTPATIENT_CLINIC_OR_DEPARTMENT_OTHER)
